# Patient Record
Sex: Female | Born: 1945 | Race: White | Hispanic: No | Smoking: Never smoker
Health system: Southern US, Community
[De-identification: ages and names within clinical notes are randomized; demographics above are authoritative.]

## PROBLEM LIST (undated history)

## (undated) DIAGNOSIS — K8000 Calculus of gallbladder with acute cholecystitis without obstruction: Principal | ICD-10-CM

## (undated) DIAGNOSIS — C73 Malignant neoplasm of thyroid gland: Secondary | ICD-10-CM

## (undated) HISTORY — PX: ABDOMINAL HYSTERECTOMY: SHX81

---

## 1982-12-19 HISTORY — PX: THYROIDECTOMY: SHX17

## 2005-10-04 ENCOUNTER — Emergency Department (HOSPITAL_COMMUNITY): Admission: EM | Admit: 2005-10-04 | Discharge: 2005-10-05 | Payer: Self-pay | Admitting: Emergency Medicine

## 2007-07-05 ENCOUNTER — Ambulatory Visit (HOSPITAL_COMMUNITY): Admission: RE | Admit: 2007-07-05 | Discharge: 2007-07-05 | Payer: Self-pay | Admitting: Obstetrics

## 2011-01-08 ENCOUNTER — Encounter: Payer: Self-pay | Admitting: Internal Medicine

## 2011-01-09 ENCOUNTER — Encounter: Payer: Self-pay | Admitting: Endocrinology

## 2011-01-09 ENCOUNTER — Encounter: Payer: Self-pay | Admitting: Obstetrics

## 2013-03-20 ENCOUNTER — Emergency Department (HOSPITAL_COMMUNITY): Payer: Medicare Other

## 2013-03-20 ENCOUNTER — Encounter (HOSPITAL_COMMUNITY): Payer: Self-pay | Admitting: Emergency Medicine

## 2013-03-20 ENCOUNTER — Encounter (HOSPITAL_COMMUNITY): Admission: EM | Disposition: A | Discharge status: Home / Self Care | Payer: Self-pay | Source: Home / Self Care

## 2013-03-20 ENCOUNTER — Encounter (HOSPITAL_COMMUNITY): Payer: Self-pay | Admitting: Anesthesiology

## 2013-03-20 ENCOUNTER — Emergency Department (HOSPITAL_COMMUNITY): Payer: Medicare Other | Admitting: Anesthesiology

## 2013-03-20 ENCOUNTER — Inpatient Hospital Stay (HOSPITAL_COMMUNITY)
Admission: EM | Admit: 2013-03-20 | Discharge: 2013-03-22 | DRG: 419 | Disposition: A | Discharge status: Home / Self Care | Payer: Medicare Other | Attending: Emergency Medicine | Admitting: Emergency Medicine

## 2013-03-20 DIAGNOSIS — Z79899 Other long term (current) drug therapy: Secondary | ICD-10-CM

## 2013-03-20 DIAGNOSIS — K8000 Calculus of gallbladder with acute cholecystitis without obstruction: Principal | ICD-10-CM | POA: Diagnosis present

## 2013-03-20 DIAGNOSIS — R1011 Right upper quadrant pain: Secondary | ICD-10-CM

## 2013-03-20 DIAGNOSIS — K824 Cholesterolosis of gallbladder: Secondary | ICD-10-CM

## 2013-03-20 DIAGNOSIS — K8050 Calculus of bile duct without cholangitis or cholecystitis without obstruction: Secondary | ICD-10-CM

## 2013-03-20 DIAGNOSIS — K8020 Calculus of gallbladder without cholecystitis without obstruction: Secondary | ICD-10-CM

## 2013-03-20 DIAGNOSIS — E0789 Other specified disorders of thyroid: Secondary | ICD-10-CM | POA: Diagnosis present

## 2013-03-20 DIAGNOSIS — K8066 Calculus of gallbladder and bile duct with acute and chronic cholecystitis without obstruction: Secondary | ICD-10-CM

## 2013-03-20 DIAGNOSIS — Z85850 Personal history of malignant neoplasm of thyroid: Secondary | ICD-10-CM

## 2013-03-20 HISTORY — DX: Calculus of gallbladder with acute cholecystitis without obstruction: K80.00

## 2013-03-20 HISTORY — DX: Malignant neoplasm of thyroid gland: C73

## 2013-03-20 HISTORY — PX: CHOLECYSTECTOMY: SHX55

## 2013-03-20 LAB — CBC WITH DIFFERENTIAL/PLATELET
Basophils Absolute: 0 10*3/uL (ref 0.0–0.1)
Basophils Relative: 0 % (ref 0–1)
Eosinophils Absolute: 0 10*3/uL (ref 0.0–0.7)
Eosinophils Relative: 0 % (ref 0–5)
HCT: 41 % (ref 36.0–46.0)
Hemoglobin: 14 g/dL (ref 12.0–15.0)
Lymphocytes Relative: 11 % — ABNORMAL LOW (ref 12–46)
Lymphs Abs: 1.1 10*3/uL (ref 0.7–4.0)
MCH: 29.6 pg (ref 26.0–34.0)
MCHC: 34.1 g/dL (ref 30.0–36.0)
MCV: 86.7 fL (ref 78.0–100.0)
Monocytes Absolute: 0.7 10*3/uL (ref 0.1–1.0)
Monocytes Relative: 6 % (ref 3–12)
Neutro Abs: 8.9 10*3/uL — ABNORMAL HIGH (ref 1.7–7.7)
Neutrophils Relative %: 83 % — ABNORMAL HIGH (ref 43–77)
Platelets: 238 10*3/uL (ref 150–400)
RBC: 4.73 MIL/uL (ref 3.87–5.11)
RDW: 12.8 % (ref 11.5–15.5)
WBC: 10.6 10*3/uL — ABNORMAL HIGH (ref 4.0–10.5)

## 2013-03-20 LAB — COMPREHENSIVE METABOLIC PANEL
ALT: 22 U/L (ref 0–35)
AST: 28 U/L (ref 0–37)
Albumin: 4.1 g/dL (ref 3.5–5.2)
Alkaline Phosphatase: 126 U/L — ABNORMAL HIGH (ref 39–117)
BUN: 8 mg/dL (ref 6–23)
CO2: 26 mEq/L (ref 19–32)
Calcium: 9.7 mg/dL (ref 8.4–10.5)
Chloride: 96 mEq/L (ref 96–112)
Creatinine, Ser: 0.4 mg/dL — ABNORMAL LOW (ref 0.50–1.10)
GFR calc Af Amer: >90 mL/min (ref >90)
GFR calc non Af Amer: >90 mL/min (ref >90)
Glucose, Bld: 129 mg/dL — ABNORMAL HIGH (ref 70–99)
Potassium: 3.7 mEq/L (ref 3.5–5.1)
Sodium: 135 mEq/L (ref 135–145)
Total Bilirubin: 0.7 mg/dL (ref 0.3–1.2)
Total Protein: 7.9 g/dL (ref 6.0–8.3)

## 2013-03-20 LAB — LIPASE, BLOOD: Lipase: 12 U/L (ref 11–59)

## 2013-03-20 SURGERY — LAPAROSCOPIC CHOLECYSTECTOMY WITH INTRAOPERATIVE CHOLANGIOGRAM
Anesthesia: General | Site: Abdomen | Wound class: Contaminated

## 2013-03-20 MED ORDER — ONDANSETRON HCL 4 MG/2ML IJ SOLN
INTRAMUSCULAR | Status: DC | PRN
  Administered 2013-03-20: 4 mg via INTRAVENOUS

## 2013-03-20 MED ORDER — LACTATED RINGERS IV SOLN: INTRAVENOUS | Status: DC

## 2013-03-20 MED ORDER — MIDAZOLAM HCL 5 MG/5ML IJ SOLN
INTRAMUSCULAR | Status: DC | PRN
  Administered 2013-03-20: 2 mg via INTRAVENOUS

## 2013-03-20 MED ORDER — HEPARIN SODIUM (PORCINE) 5000 UNIT/ML IJ SOLN
5000.0000 [IU] | Freq: Three times a day (TID) | INTRAMUSCULAR | Status: DC
  Administered 2013-03-21 – 2013-03-22 (×4): 5000 [IU] via SUBCUTANEOUS
  Filled 2013-03-20 (×7): qty 1

## 2013-03-20 MED ORDER — BUPIVACAINE-EPINEPHRINE PF 0.25-1:200000 % IJ SOLN
INTRAMUSCULAR | Status: AC
  Filled 2013-03-20: qty 30

## 2013-03-20 MED ORDER — DEXTROSE 5 % IV SOLN
1.0000 g | INTRAVENOUS | Status: DC
  Administered 2013-03-21: 1 g via INTRAVENOUS
  Filled 2013-03-20 (×2): qty 10

## 2013-03-20 MED ORDER — ONDANSETRON HCL 4 MG/2ML IJ SOLN
4.0000 mg | Freq: Once | INTRAMUSCULAR | Status: AC
  Administered 2013-03-20: 4 mg via INTRAVENOUS
  Filled 2013-03-20: qty 2

## 2013-03-20 MED ORDER — FENTANYL CITRATE 0.05 MG/ML IJ SOLN
50.0000 ug | Freq: Once | INTRAMUSCULAR | Status: AC
  Administered 2013-03-20: 100 ug via INTRAVENOUS
  Filled 2013-03-20: qty 2

## 2013-03-20 MED ORDER — HYDROMORPHONE HCL PF 1 MG/ML IJ SOLN
INTRAMUSCULAR | Status: AC
  Filled 2013-03-20: qty 1

## 2013-03-20 MED ORDER — NEOSTIGMINE METHYLSULFATE 1 MG/ML IJ SOLN
INTRAMUSCULAR | Status: DC | PRN
  Administered 2013-03-20: 4 mg via INTRAVENOUS

## 2013-03-20 MED ORDER — ONDANSETRON HCL 4 MG PO TABS: 4.0000 mg | ORAL_TABLET | Freq: Four times a day (QID) | ORAL | Status: DC | PRN

## 2013-03-20 MED ORDER — DEXTROSE 5 % IV SOLN
1.0000 g | INTRAVENOUS | Status: AC
  Administered 2013-03-20: 1 g via INTRAVENOUS
  Filled 2013-03-20: qty 10

## 2013-03-20 MED ORDER — ROCURONIUM BROMIDE 100 MG/10ML IV SOLN
INTRAVENOUS | Status: DC | PRN
  Administered 2013-03-20: 30 mg via INTRAVENOUS

## 2013-03-20 MED ORDER — OXYCODONE-ACETAMINOPHEN 5-325 MG PO TABS: 1.0000 | ORAL_TABLET | ORAL | Status: DC | PRN

## 2013-03-20 MED ORDER — HYDROMORPHONE HCL PF 1 MG/ML IJ SOLN: 0.2500 mg | INTRAMUSCULAR | Status: DC | PRN

## 2013-03-20 MED ORDER — MORPHINE SULFATE 10 MG/ML IJ SOLN: 2.0000 mg | INTRAMUSCULAR | Status: DC | PRN

## 2013-03-20 MED ORDER — PROPOFOL 10 MG/ML IV BOLUS
INTRAVENOUS | Status: DC | PRN
  Administered 2013-03-20: 160 mg via INTRAVENOUS

## 2013-03-20 MED ORDER — SODIUM CHLORIDE 0.9 % IV BOLUS (SEPSIS)
500.0000 mL | Freq: Once | INTRAVENOUS | Status: AC
  Administered 2013-03-20: 500 mL via INTRAVENOUS

## 2013-03-20 MED ORDER — DEXAMETHASONE SODIUM PHOSPHATE 10 MG/ML IJ SOLN
INTRAMUSCULAR | Status: DC | PRN
Start: 2013-03-20 — End: 2013-03-20
  Administered 2013-03-20: 10 mg via INTRAVENOUS

## 2013-03-20 MED ORDER — THYROID 60 MG PO TABS
60.0000 mg | ORAL_TABLET | Freq: Every day | ORAL | Status: DC
  Administered 2013-03-21: 60 mg via ORAL
  Filled 2013-03-20 (×2): qty 1

## 2013-03-20 MED ORDER — ONDANSETRON HCL 4 MG/2ML IJ SOLN: 4.0000 mg | Freq: Four times a day (QID) | INTRAMUSCULAR | Status: DC | PRN

## 2013-03-20 MED ORDER — SUCCINYLCHOLINE CHLORIDE 20 MG/ML IJ SOLN
INTRAMUSCULAR | Status: DC | PRN
  Administered 2013-03-20: 100 mg via INTRAVENOUS

## 2013-03-20 MED ORDER — LACTATED RINGERS IV SOLN
INTRAVENOUS | Status: DC | PRN
  Administered 2013-03-20: 500 mL via INTRAVENOUS

## 2013-03-20 MED ORDER — ACETAMINOPHEN 10 MG/ML IV SOLN
INTRAVENOUS | Status: DC | PRN
  Administered 2013-03-20: 1000 mg via INTRAVENOUS

## 2013-03-20 MED ORDER — IOHEXOL 300 MG/ML  SOLN
INTRAMUSCULAR | Status: AC
  Filled 2013-03-20: qty 1

## 2013-03-20 MED ORDER — KCL IN DEXTROSE-NACL 20-5-0.9 MEQ/L-%-% IV SOLN
INTRAVENOUS | Status: DC
  Administered 2013-03-20 – 2013-03-21 (×2): via INTRAVENOUS
  Filled 2013-03-20 (×3): qty 1000

## 2013-03-20 MED ORDER — BUPIVACAINE-EPINEPHRINE 0.25% -1:200000 IJ SOLN
INTRAMUSCULAR | Status: DC | PRN
  Administered 2013-03-20: 20 mL

## 2013-03-20 MED ORDER — IOHEXOL 300 MG/ML  SOLN
INTRAMUSCULAR | Status: DC | PRN
  Administered 2013-03-20: 5 mL via INTRAVENOUS

## 2013-03-20 MED ORDER — LACTATED RINGERS IV SOLN
INTRAVENOUS | Status: DC
  Administered 2013-03-20 (×2): via INTRAVENOUS

## 2013-03-20 MED ORDER — GLYCOPYRROLATE 0.2 MG/ML IJ SOLN
INTRAMUSCULAR | Status: DC | PRN
  Administered 2013-03-20: 0.6 mg via INTRAVENOUS

## 2013-03-20 MED ORDER — FENTANYL CITRATE 0.05 MG/ML IJ SOLN
INTRAMUSCULAR | Status: DC | PRN
  Administered 2013-03-20: 100 ug via INTRAVENOUS
  Administered 2013-03-20 (×3): 50 ug via INTRAVENOUS

## 2013-03-20 MED ORDER — LIDOCAINE HCL (CARDIAC) 20 MG/ML IV SOLN
INTRAVENOUS | Status: DC | PRN
  Administered 2013-03-20: 50 mg via INTRAVENOUS

## 2013-03-20 MED ORDER — ACETAMINOPHEN 10 MG/ML IV SOLN
INTRAVENOUS | Status: AC
  Filled 2013-03-20: qty 100

## 2013-03-20 SURGICAL SUPPLY — 48 items
ADH SKN CLS APL DERMABOND .7 (GAUZE/BANDAGES/DRESSINGS) ×1
APL SKNCLS STERI-STRIP NONHPOA (GAUZE/BANDAGES/DRESSINGS)
APPLIER CLIP ROT 10 11.4 M/L (STAPLE) ×2
APR CLP MED LRG 11.4X10 (STAPLE) ×1
BAG SPEC RTRVL LRG 6X4 10 (ENDOMECHANICALS)
BENZOIN TINCTURE PRP APPL 2/3 (GAUZE/BANDAGES/DRESSINGS) IMPLANT
CANISTER SUCTION 2500CC (MISCELLANEOUS) ×2 IMPLANT
CATH REDDICK CHOLANGI 4FR 50CM (CATHETERS) IMPLANT
CHLORAPREP W/TINT 26ML (MISCELLANEOUS) ×2 IMPLANT
CLIP APPLIE ROT 10 11.4 M/L (STAPLE) ×1 IMPLANT
CLOTH BEACON ORANGE TIMEOUT ST (SAFETY) ×2 IMPLANT
COVER MAYO STAND STRL (DRAPES) ×2 IMPLANT
DECANTER SPIKE VIAL GLASS SM (MISCELLANEOUS) ×2 IMPLANT
DERMABOND ADVANCED (GAUZE/BANDAGES/DRESSINGS) ×1
DERMABOND ADVANCED .7 DNX12 (GAUZE/BANDAGES/DRESSINGS) IMPLANT
DRAIN CHANNEL 19F RND (DRAIN) ×1 IMPLANT
DRAPE C-ARM 42X72 X-RAY (DRAPES) ×2 IMPLANT
DRAPE LAPAROSCOPIC ABDOMINAL (DRAPES) ×2 IMPLANT
ELECT REM PT RETURN 9FT ADLT (ELECTROSURGICAL) ×2
ELECTRODE REM PT RTRN 9FT ADLT (ELECTROSURGICAL) ×1 IMPLANT
EVACUATOR SILICONE 100CC (DRAIN) ×1 IMPLANT
GLOVE BIOGEL PI IND STRL 7.0 (GLOVE) ×1 IMPLANT
GLOVE BIOGEL PI INDICATOR 7.0 (GLOVE) ×1
GLOVE SS BIOGEL STRL SZ 7.5 (GLOVE) ×1 IMPLANT
GLOVE SUPERSENSE BIOGEL SZ 7.5 (GLOVE) ×1
GOWN STRL NON-REIN LRG LVL3 (GOWN DISPOSABLE) ×2 IMPLANT
GOWN STRL REIN XL XLG (GOWN DISPOSABLE) ×4 IMPLANT
HEMOSTAT SURGICEL 4X8 (HEMOSTASIS) ×1 IMPLANT
IV CATH 14GX2 1/4 (CATHETERS) IMPLANT
IV SET EXT 30 76VOL 4 MALE LL (IV SETS) IMPLANT
KIT BASIN OR (CUSTOM PROCEDURE TRAY) ×2 IMPLANT
NS IRRIG 1000ML POUR BTL (IV SOLUTION) IMPLANT
POUCH SPECIMEN RETRIEVAL 10MM (ENDOMECHANICALS) IMPLANT
SET CHOLANGIOGRAPH MIX (MISCELLANEOUS) ×2 IMPLANT
SET IRRIG TUBING LAPAROSCOPIC (IRRIGATION / IRRIGATOR) ×2 IMPLANT
SOLUTION ANTI FOG 6CC (MISCELLANEOUS) ×2 IMPLANT
STOPCOCK K 69 2C6206 (IV SETS) IMPLANT
STRIP CLOSURE SKIN 1/2X4 (GAUZE/BANDAGES/DRESSINGS) IMPLANT
SUT ETHILON 2 0 PS N (SUTURE) ×1 IMPLANT
SUT MNCRL AB 4-0 PS2 18 (SUTURE) ×2 IMPLANT
TOWEL OR 17X26 10 PK STRL BLUE (TOWEL DISPOSABLE) ×2 IMPLANT
TRAY LAP CHOLE (CUSTOM PROCEDURE TRAY) ×2 IMPLANT
TROCAR SLEEVE XCEL 5X75 (ENDOMECHANICALS) ×2 IMPLANT
TROCAR XCEL BLADELESS 5X75MML (TROCAR) ×2 IMPLANT
TROCAR XCEL BLUNT TIP 100MML (ENDOMECHANICALS) ×2 IMPLANT
TROCAR XCEL NON-BLD 11X100MML (ENDOMECHANICALS) ×2 IMPLANT
TROCAR Z-THREAD FIOS 5X100MM (TROCAR) ×2 IMPLANT
TUBING INSUFFLATION 10FT LAP (TUBING) ×2 IMPLANT

## 2013-03-20 NOTE — ED Notes (Signed)
Pt transported to [facility].

## 2013-03-20 NOTE — Anesthesia Preprocedure Evaluation (Addendum)
Anesthesia Evaluation  Patient identified by MRN, date of birth, ID band Patient awake    Reviewed: Allergy & Precautions, H&P , NPO status , Patient's Chart, lab work & pertinent test results  Airway Mallampati: II TM Distance: >3 FB Neck ROM: full    Dental no notable dental hx. (+) Teeth Intact and Dental Advisory Given   Pulmonary neg pulmonary ROS,  breath sounds clear to auscultation  Pulmonary exam normal       Cardiovascular Exercise Tolerance: Good negative cardio ROS  Rhythm:regular Rate:Normal     Neuro/Psych negative neurological ROS  negative psych ROS   GI/Hepatic negative GI ROS, Neg liver ROS,   Endo/Other  negative endocrine ROSHypothyroidism   Renal/GU negative Renal ROS  negative genitourinary   Musculoskeletal   Abdominal   Peds  Hematology negative hematology ROS (+)   Anesthesia Other Findings   Reproductive/Obstetrics negative OB ROS                          Anesthesia Physical Anesthesia Plan  ASA: I  Anesthesia Plan: General   Post-op Pain Management:    Induction: Intravenous  Airway Management Planned: Oral ETT  Additional Equipment:   Intra-op Plan:   Post-operative Plan: Extubation in OR  Informed Consent: I have reviewed the patients History and Physical, chart, labs and discussed the procedure including the risks, benefits and alternatives for the proposed anesthesia with the patient or authorized representative who has indicated his/her understanding and acceptance.   Dental Advisory Given  Plan Discussed with: CRNA and Surgeon  Anesthesia Plan Comments:         Anesthesia Quick Evaluation

## 2013-03-20 NOTE — H&P (Signed)
Tina Rose is an 67 y.o. female.    Chief Complaint: abdominal pain  HPI: patient is a very pleasant 67 year old female who developed the sudden onset of severe right upper quadrant abdominal pain 2 days ago. She has not had any previous history of significant abdominal complaints were similar problems. She describes severe pressure-like pain in her right upper quadrant and radiated somewhat around to her right flank. This has been associated with nausea and vomiting. Today she was seen in the urgent clinic at [facility] and an abdominal ultrasound was obtained. This shows cholelithiasis with a single gallstone which appears lodged in the gallbladder neck. There was Rose appreciable gallbladder wall thickening or pericholecystic fluid. Common bile duct was normal at 0.6 cm. Kidney pancreas aorta IVC all looked unremarkable. Her pain has continued unimproved and she was referred to the emergency department for further evaluation and treatment. She denies fever or chills or jaundice. Bowel movements have been normal without blood.  Past Medical History  Diagnosis Date  . Cancer of thyroid     Past Surgical History  Procedure Laterality Date  . Abdominal hysterectomy    . Thyroidectomy  1984    Rose family history on file. Social History:  reports that she has never smoked. She does not have any smokeless tobacco history on file. She reports that she does not drink alcohol or use illicit drugs.  Allergies: Rose Known Allergies  Rose current facility-administered medications for this encounter.   Current Outpatient Prescriptions  Medication Sig Dispense Refill  . thyroid (ARMOUR) 60 MG tablet Take 60 mg by mouth daily.         Results for orders placed during the hospital encounter of 03/20/13 (from the past 48 hour(s))  CBC WITH DIFFERENTIAL     Status: Abnormal   Collection Time    03/20/13 12:25 PM      Result Value Range   WBC 10.6 (*) 4.0 - 10.5 K/uL   RBC 4.73  3.87 - 5.11  MIL/uL   Hemoglobin 14.0  12.0 - 15.0 g/dL   HCT 52.8  41.3 - 24.4 %   MCV 86.7  78.0 - 100.0 fL   MCH 29.6  26.0 - 34.0 pg   MCHC 34.1  30.0 - 36.0 g/dL   RDW 01.0  27.2 - 53.6 %   Platelets 238  150 - 400 K/uL   Neutrophils Relative 83 (*) 43 - 77 %   Neutro Abs 8.9 (*) 1.7 - 7.7 K/uL   Lymphocytes Relative 11 (*) 12 - 46 %   Lymphs Abs 1.1  0.7 - 4.0 K/uL   Monocytes Relative 6  3 - 12 %   Monocytes Absolute 0.7  0.1 - 1.0 K/uL   Eosinophils Relative 0  0 - 5 %   Eosinophils Absolute 0.0  0.0 - 0.7 K/uL   Basophils Relative 0  0 - 1 %   Basophils Absolute 0.0  0.0 - 0.1 K/uL  COMPREHENSIVE METABOLIC PANEL     Status: Abnormal   Collection Time    03/20/13 12:25 PM      Result Value Range   Sodium 135  135 - 145 mEq/L   Potassium 3.7  3.5 - 5.1 mEq/L   Chloride 96  96 - 112 mEq/L   CO2 26  19 - 32 mEq/L   Glucose, Bld 129 (*) 70 - 99 mg/dL   BUN 8  6 - 23 mg/dL   Creatinine, Ser 6.44 (*) 0.50 -  1.10 mg/dL   Calcium 9.7  8.4 - 16.1 mg/dL   Total Protein 7.9  6.0 - 8.3 g/dL   Albumin 4.1  3.5 - 5.2 g/dL   AST 28  0 - 37 U/L   ALT 22  0 - 35 U/L   Alkaline Phosphatase 126 (*) 39 - 117 U/L   Total Bilirubin 0.7  0.3 - 1.2 mg/dL   GFR calc non Af Amer >90  >90 mL/min   GFR calc Af Amer >90  >90 mL/min   Comment:            The eGFR has been calculated     using the CKD EPI equation.     This calculation has not been     validated in all clinical     situations.     eGFR's persistently     <90 mL/min signify     possible Chronic Kidney Disease.  LIPASE, BLOOD     Status: None   Collection Time    03/20/13 12:25 PM      Result Value Range   Lipase 12  11 - 59 U/L   Rose results found.  Review of Systems  Constitutional: Negative for fever and chills.  Respiratory: Negative.   Cardiovascular: Negative.   Genitourinary: Negative.     Pulse 93, temperature 98.2 F (36.8 C), temperature source Oral, resp. rate 16, SpO2 94.00%. Physical Exam  General:  Healthy-appearing female who looks younger than her stated age in Rose severe distress Skin: Warm and dry without rash or infection HEENT: Well-healed thyroidectomy scar. Rose neck masses. Sclera nonicteric. Oropharynx clear. Lymph nodes: Rose cervical, supraclavicular or inguinal nodes palpable Lungs: Clear equal breath sounds without wheezing or increased work of breathing Cardiovascular: Regular rate and rhythm without murmur. Rose peripheral edema. Peripheral pulses intact. Abdomen: There is marked right upper quadrant tenderness with guarding. Rose discernible masses or organomegaly. Remainder of her abdomen is soft and nontender. Rose hernias. Extremities: Rose joint swelling or edema or cyanosis Neurologic: Alert and fully oriented. Affect normal. Rose gross motor deficits.  Assessment/Plan Acute severe persistent right upper quadrant abdominal pain with ultrasound showing a gallstone impacted in the neck of the gallbladder. This all appears consistent with acute cholecystitis. I have recommended proceeding with urgent laparoscopic cholecystectomy.I discussed the procedure in detail.    We discussed the risks and benefits of a laparoscopic cholecystectomy and possible cholangiogram including, but not limited to bleeding, infection, injury to surrounding structures such as the intestine or liver, bile leak, retained gallstones, need to convert to an open procedure, prolonged diarrhea, blood clots such as  DVT, common bile duct injury, anesthesia risks, and possible need for additional procedures.  The likelihood of improvement in symptoms and return to the patient's normal status is good. We discussed the typical post-operative recovery course. She is receiving preoperative IV antibiotics.  Dailon Sheeran T 03/20/2013, 1:59 PM

## 2013-03-20 NOTE — Transfer of Care (Signed)
Immediate Anesthesia Transfer of Care Note  Patient: Tina Rose  Procedure(s) Performed: Procedure(s): LAPAROSCOPIC CHOLECYSTECTOMY WITH INTRAOPERATIVE CHOLANGIOGRAM (N/A)  Patient Location: PACU  Anesthesia Type:General  Level of Consciousness: awake, alert  and oriented  Airway & Oxygen Therapy: Patient Spontanous Breathing and Patient connected to face mask oxygen  Post-op Assessment: Report given to PACU RN and Post -op Vital signs reviewed and stable  Post vital signs: Reviewed and stable  Complications: No apparent anesthesia complications

## 2013-03-20 NOTE — Anesthesia Postprocedure Evaluation (Signed)
  Anesthesia Post-op Note  Patient: Tina Rose  Procedure(s) Performed: Procedure(s) (LRB): LAPAROSCOPIC CHOLECYSTECTOMY WITH INTRAOPERATIVE CHOLANGIOGRAM (N/A)  Patient Location: PACU  Anesthesia Type: General  Level of Consciousness: awake and alert   Airway and Oxygen Therapy: Patient Spontanous Breathing  Post-op Pain: mild  Post-op Assessment: Post-op Vital signs reviewed, Patient's Cardiovascular Status Stable, Respiratory Function Stable, Patent Airway and No signs of Nausea or vomiting  Last Vitals:  Filed Vitals:   03/20/13 1830  BP: 142/58  Pulse: 75  Temp:   Resp: 18    Post-op Vital Signs: stable   Complications: No apparent anesthesia complications

## 2013-03-20 NOTE — Op Note (Signed)
Preoperative diagnosis: Cholelithiasis and acute cholecystitis  Postoperative diagnosis: Cholelithiasis and acute gangrenous cholecystitis  Surgical procedure: Laparoscopic cholecystectomy with intraoperative cholangiogram  Surgeon: Sharlet Salina T. Zyan Coby M.D.  Assistant: None  Anesthesia: General Endotracheal  Complications: None  Estimated blood loss: Minimal  Description of procedure: The patient brought to the operating room, placed in the supine position on the operating table, and general endotracheal anesthesia induced. The abdomen was widely sterilely prepped and draped. The patient had received preoperative IV antibiotics and PAS were in place. Patient timeout was performed the correct procedure verified. Standard 4 port technique was used with an open Hassan cannula at the umbilicus and the remainder of the ports placed under direct vision. The gallbladder was visualized. It appeared acutely inflamed and tense and partially necrotic The gallbladder was decompressed with an aspiration needle.. The fundus was grasped and elevated up over the liver and the infundibulum retracted inferiolaterally. Peritoneum anterior and posterior to close triangle was incised and fibrofatty tissue stripped off the neck of the gallbladder toward the porta hepatis. The distal gallbladder was thoroughly dissected. The cystic artery was identified in close triangle and the cystic duct gallbladder junction dissected 360.  A good critical view was obtained. When the anatomy was clear the cystic duct was clipped at the gallbladder junction and an operative cholangiogram obtained through the cystic duct. This showed good filling of a normal common bile duct and intrahepatic ducts with free flow into the duodenum and no filling defects. Following this the Cholangiocath was removed and the cystic duct was doubly clipped proximally and divided. The cystic artery was doubly clipped proximally and distally and divided. The  gallbladder was dissected free from its bed using hook cautery and removed through the umbilical port site. Complete hemostasis was obtained in the gallbladder bed. The right upper quadrant was thoroughly irrigated and hemostasis assured. A closed suction drain was left in Morrison's pouch. Trochars were removed and all CO2 evacuated and the [facility] trocar site fascial defect closed. Skin incisions were closed with subcuticular Monocryl and Dermabond. Sponge needle and instrument counts were correct. The patient was taken to PACU in good condition.  Vrishank Moster T  03/20/2013

## 2013-03-20 NOTE — ED Notes (Signed)
Report to [facility] in [place] [facility].

## 2013-03-20 NOTE — ED Provider Notes (Signed)
History     CSN: [id number]  Arrival date & time 03/20/13  1201   First MD Initiated Contact with Patient 03/20/13 1225      Chief Complaint  Patient presents with  . Abdominal Pain    (Consider location/radiation/quality/duration/timing/severity/associated sxs/prior treatment) Patient is a 67 y.o. female presenting with abdominal pain. The history is provided by the patient.  Abdominal Pain Associated symptoms: nausea and vomiting   Associated symptoms: no chest pain, no constipation, no diarrhea, no fatigue, no fever and no shortness of breath    patient has had upper abdominal pain for the last 3 days. She's also had nausea and vomiting. No diarrhea. No constipation. She is seen in urgent care and was sent for an ultrasound. The ultrasound showed biliary neck stone no wall thickening or pericholecystic fluid. Common bile duct was normal. She was sent in here for further evaluation and likely to be seen by general surgery. Patient states she's otherwise healthy. No fevers. She states she has had no appetite and has not eaten today. Pain is tall and in the right upper abdomen. It is constant. It is worse after eating.  Past Medical History  Diagnosis Date  . Cancer of thyroid     Past Surgical History  Procedure Laterality Date  . Abdominal hysterectomy    . Thyroidectomy  1984    No family history on file.  History  Substance Use Topics  . Smoking status: Never Smoker   . Smokeless tobacco: Not on file  . Alcohol Use: No    OB History   Grav Para Term Preterm Abortions TAB SAB Ect Mult Living                  Review of Systems  Constitutional: Positive for appetite change. Negative for fever, activity change and fatigue.  HENT: Negative for neck stiffness.   Eyes: Negative for pain.  Respiratory: Negative for chest tightness and shortness of breath.   Cardiovascular: Negative for chest pain and leg swelling.  Gastrointestinal: Positive for nausea, vomiting and  abdominal pain. Negative for diarrhea and constipation.  Genitourinary: Negative for flank pain.  Musculoskeletal: Negative for back pain.  Skin: Negative for rash.  Neurological: Negative for weakness, numbness and headaches.  Psychiatric/Behavioral: Negative for behavioral problems.    Allergies  Review of patient's allergies indicates no known allergies.  Home Medications   Current Outpatient Rx  Name  Route  Sig  Dispense  Refill  . thyroid (ARMOUR) 60 MG tablet   Oral   Take 60 mg by mouth daily.           BP 174/69  Pulse 89  Temp(Src) 98.2 F (36.8 C) (Oral)  Resp 16  SpO2 99%  Physical Exam  Nursing note and vitals reviewed. Constitutional: She is oriented to person, place, and time. She appears well-developed and well-nourished.  HENT:  Head: Normocephalic and atraumatic.  Eyes: EOM are normal. Pupils are equal, round, and reactive to light.  Neck: Normal range of motion. Neck supple.  Cardiovascular: Normal rate, regular rhythm and normal heart sounds.   No murmur heard. Pulmonary/Chest: Effort normal and breath sounds normal. No respiratory distress. She has no wheezes. She has no rales.  Abdominal: Soft. Bowel sounds are normal. She exhibits no distension. There is tenderness. There is no rebound and no guarding.  Moderate tenderness in right upper abdomen. No rebound or guarding.  Musculoskeletal: Normal range of motion.  Neurological: She is alert and oriented to person,  place, and time. No cranial nerve deficit.  Skin: Skin is warm and dry.  Psychiatric: She has a normal mood and affect. Her speech is normal.    ED Course  Procedures (including critical care time)  Labs Reviewed  CBC WITH DIFFERENTIAL - Abnormal; Notable for the following:    WBC 10.6 (*)    Neutrophils Relative 83 (*)    Neutro Abs 8.9 (*)    Lymphocytes Relative 11 (*)    All other components within normal limits  COMPREHENSIVE METABOLIC PANEL - Abnormal; Notable for the  following:    Glucose, Bld 129 (*)    Creatinine, Ser 0.40 (*)    Alkaline Phosphatase 126 (*)    All other components within normal limits  LIPASE, BLOOD   Dg Chest 2 View  03/20/2013  *RADIOLOGY REPORT*  Clinical Data: Thyroid cancer  CHEST - 2 VIEW  Comparison: October 04, 2005.  Findings: Cardiomediastinal silhouette appears normal.  No acute pulmonary disease is noted.  Bony thorax is intact.  IMPRESSION: No acute cardiopulmonary abnormality seen.   Original Report Authenticated By: Lupita Raider.,  M.D.      1. Biliary colic     Date: 03/20/2013  Rate:87  Rhythm: normal sinus rhythm  QRS Axis: normal  Intervals: normal  ST/T Wave abnormalities: normal  Conduction Disutrbances: none  Narrative Interpretation: unremarkable      MDM  Patient was sent in with the ultrasound showing gallbladder neck stone. She's had continued pain. She's been unable to eat. Patient was seen by general surgery and will be taken to the operating room        [facility] R. Rubin Payor, MD 03/20/13 1515

## 2013-03-20 NOTE — ED Notes (Signed)
Pt states " I have a gall stone lodged and I was told to come here" Pt complains of pain to right abdominal area and nausea and vomiting at this time.

## 2013-03-20 NOTE — Progress Notes (Signed)
Pt confirms pcp is Elie Confer  EPIC updated

## 2013-03-21 ENCOUNTER — Encounter (HOSPITAL_COMMUNITY): Payer: Self-pay | Admitting: General Surgery

## 2013-03-21 DIAGNOSIS — K8000 Calculus of gallbladder with acute cholecystitis without obstruction: Principal | ICD-10-CM | POA: Diagnosis present

## 2013-03-21 HISTORY — DX: Calculus of gallbladder with acute cholecystitis without obstruction: K80.00

## 2013-03-21 LAB — CBC
Hemoglobin: 12.6 g/dL (ref 12.0–15.0)
MCH: 29.6 pg (ref 26.0–34.0)
MCV: 87.6 fL (ref 78.0–100.0)
RBC: 4.26 MIL/uL (ref 3.87–5.11)

## 2013-03-21 LAB — COMPREHENSIVE METABOLIC PANEL
AST: 35 U/L (ref 0–37)
Albumin: 3.1 g/dL — ABNORMAL LOW (ref 3.5–5.2)
Calcium: 9.1 mg/dL (ref 8.4–10.5)
Creatinine, Ser: 0.4 mg/dL — ABNORMAL LOW (ref 0.50–1.10)
Total Protein: 6.5 g/dL (ref 6.0–8.3)

## 2013-03-21 NOTE — Care Management Note (Signed)
    Page 1 of 1   03/21/2013     11:48:18 AM   CARE MANAGEMENT NOTE 03/21/2013  Patient:  Tina Rose, Tina Rose   Account Number:  [id number]  Date Initiated:  03/21/2013  Documentation initiated by:  Lorenda Ishihara  Subjective/Objective Assessment:   67 yo female admitted s/p lap cholectomy. PTA lived at home alone.     Action/Plan:   Home when stable   Anticipated DC Date:  03/22/2013   Anticipated DC Plan:  HOME/SELF CARE      DC Planning Services  CM consult      Choice offered to / List presented to:             Status of service:  Completed, signed off Medicare Important Message given?   (If response is "NO", the following Medicare IM given date fields will be blank) Date Medicare IM given:   Date Additional Medicare IM given:    Discharge Disposition:  HOME/SELF CARE  Per UR Regulation:  Reviewed for med. necessity/level of care/duration of stay  If discussed at Long Length of Stay Meetings, dates discussed:    Comments:

## 2013-03-21 NOTE — Discharge Summary (Signed)
Physician Discharge Summary  Patient ID: Tina Rose MRN: [id number] DOB/AGE: 1946-03-14 67 y.o.  Admit date: 03/20/2013 Discharge date: 03/22/2013  Admission Diagnoses: Cholelithiasis and acute gangrenous cholecystitis   Discharge Diagnoses: Cholelithiasis and acute gangrenous cholecystitis  Active Problems:   Calculus of gallbladder with acute cholecystitis, without mention of obstruction   PROCEDURES: Laparoscopic cholecystectomy with intraoperative cholangiogram   Hospital Course:  patient is a very pleasant 67 year old female who developed the sudden onset of severe right upper quadrant abdominal pain 2 days ago. She has not had any previous history of significant abdominal complaints were similar problems. She describes severe pressure-like pain in her right upper quadrant and radiated somewhat around to her right flank. This has been associated with nausea and vomiting. Today she was seen in the urgent clinic at [facility] and an abdominal ultrasound was obtained. This shows cholelithiasis with a single gallstone which appears lodged in the gallbladder neck. There was no appreciable gallbladder wall thickening or pericholecystic fluid. Common bile duct was normal at 0.6 cm. Kidney pancreas aorta IVC all looked unremarkable. Her pain has continued unimproved and she was referred to the emergency department for further evaluation and treatment. She denies fever or chills or jaundice. Bowel movements have been normal without blood. She was seen and taken to the OR by Dr. Johna Sheriff. She was found to have an acute gangrenous gallbladder. She tolerated surgery well.  A drain was left in place.  She was maintained on antibiotics and diet was advanced. By the 2nd post op day she was ready for discharge.  Follow up in 2 weeks. Condition on D/c:  Improved   Disposition: Final discharge disposition not confirmed     Medication List    TAKE these medications       acetaminophen  325 MG tablet  Commonly known as:  TYLENOL  Do not take more than 4000 mg of tylenol, (acetaminophen) in any 24 hour period.  There is tylenol in your prescription pain medicine.     oxyCODONE-acetaminophen 5-325 MG per tablet  Commonly known as:  PERCOCET/ROXICET  Take 1-2 tablets by mouth every 4 (four) hours as needed.     thyroid 60 MG tablet  Commonly known as:  ARMOUR  Take 60 mg by mouth daily.       Follow-up Information   Schedule an appointment as soon as possible for a visit with Mariella Saa, MD.   Contact information:   [address] [place] [address] [phone]       Signed: Sherrie George 03/22/2013, 9:36 AM

## 2013-03-21 NOTE — Progress Notes (Signed)
Patient ID: Tina Rose, female   DOB: 06-13-1946, 67 y.o.   MRN: [id number] 1 Day Post-Op  Subjective: Feels much better than before surgery, pain is relieved, just sore. No nausea.  Objective: Vital signs in last 24 hours: Temp:  [98 F (36.7 C)-98.8 F (37.1 C)] 98.6 F (37 C) (04/03 0545) Pulse Rate:  [71-93] 76 (04/03 0545) Resp:  [14-20] 16 (04/03 0545) BP: (139-176)/(58-84) 152/74 mmHg (04/03 0545) SpO2:  [94 %-100 %] 96 % (04/03 0545) Weight:  [143 lb (64.864 kg)] 143 lb (64.864 kg) (04/02 1940) Last BM Date: 03/18/13  Intake/Output from previous day: 04/02 0701 - 04/03 0700 In: 1823.8 [I.V.:1823.8] Out: 2860 [Urine:2800; Drains:60] Intake/Output this shift: Total I/O In: 300 [I.V.:300] Out: -   General appearance: alert, cooperative and no distress GI: mild tenderness in the right upper quadrant. JP drainage is serosanguineous. Incision/Wound:incisions clean and dry.  Lab Results:   Recent Labs  03/20/13 1225 03/21/13 0453  WBC 10.6* 10.7*  HGB 14.0 12.6  HCT 41.0 37.3  PLT 238 196   BMET  Recent Labs  03/20/13 1225 03/21/13 0453  NA 135 137  K 3.7 3.9  CL 96 102  CO2 26 28  GLUCOSE 129* 177*  BUN 8 6  CREATININE 0.40* 0.40*  CALCIUM 9.7 9.1   Lab Results  Component Value Date   ALT 32 03/21/2013   AST 35 03/21/2013   ALKPHOS 99 03/21/2013   BILITOT 0.5 03/21/2013    Studies/Results: Dg Chest 2 View  03/20/2013  *RADIOLOGY REPORT*  Clinical Data: Thyroid cancer  CHEST - 2 VIEW  Comparison: October 04, 2005.  Findings: Cardiomediastinal silhouette appears normal.  No acute pulmonary disease is noted.  Bony thorax is intact.  IMPRESSION: No acute cardiopulmonary abnormality seen.   Original Report Authenticated By: Lupita Raider.,  M.D.    Dg Cholangiogram Operative  03/20/2013  *RADIOLOGY REPORT*  Clinical Data:   Cholecystectomy.  INTRAOPERATIVE CHOLANGIOGRAM  Technique:  Cholangiographic images from the C-arm fluoroscopic device were submitted  for interpretation post-operatively.  Please see the procedural report for the amount of contrast and the fluoroscopy time utilized.  Comparison:  None.  Findings:  Cine fluoroscopic spot images from an intraoperative cholangiogram demonstrate cannulation of the cystic duct.  The common bile duct is normal in caliber.  No filling defects are seen.  There is normal drainage of contrast into the duodenum.  The visualized intrahepatic ducts appear normal.  No contrast extravasation is seen.  IMPRESSION: Normal intraoperative cholangiogram.   Original Report Authenticated By: Rudie Meyer, M.D.     Anti-infectives: Anti-infectives   Start     Dose/Rate Route Frequency Ordered Stop   03/21/13 1400  cefTRIAXone (ROCEPHIN) 1 g in dextrose 5 % 50 mL IVPB     1 g 100 mL/hr over 30 Minutes Intravenous Every 24 hours 03/20/13 1942     03/20/13 1430  cefTRIAXone (ROCEPHIN) 1 g in dextrose 5 % 50 mL IVPB     1 g 100 mL/hr over 30 Minutes Intravenous NOW 03/20/13 1417 03/20/13 1501      Assessment/Plan: s/p Procedure(s): LAPAROSCOPIC CHOLECYSTECTOMY WITH INTRAOPERATIVE CHOLANGIOGRAM Severe acute cholecystitis with early gangrenous changes. Doing well postoperatively. Will continue IV antibiotics today and repeat lab in the morning. Probable discharge tomorrow.   LOS: 1 day    Nolberto Cheuvront T 03/21/2013

## 2013-03-22 ENCOUNTER — Encounter (HOSPITAL_COMMUNITY): Payer: Self-pay | Admitting: General Surgery

## 2013-03-22 LAB — CBC
HCT: 34.8 % — ABNORMAL LOW (ref 36.0–46.0)
MCHC: 33 g/dL (ref 30.0–36.0)
MCV: 89.5 fL (ref 78.0–100.0)
RDW: 13.5 % (ref 11.5–15.5)
WBC: 8.3 10*3/uL (ref 4.0–10.5)

## 2013-03-22 MED ORDER — OXYCODONE-ACETAMINOPHEN 5-325 MG PO TABS: 1.0000 | ORAL_TABLET | ORAL | Status: DC | PRN

## 2013-03-22 MED ORDER — ACETAMINOPHEN 325 MG PO TABS: ORAL_TABLET | ORAL | Status: DC

## 2013-03-22 NOTE — Progress Notes (Signed)
Discharge instructions reviewed with patient, vital signs are stable, incisions are within normal limits, prescription given and JP drain removed by PA Sherrie George, patient is to follow up with MD within 1-2 weeks Stanford Breed RN 03-22-2013

## 2013-03-22 NOTE — Progress Notes (Signed)
Patient interviewed and examined, agree with PA note above. She is doing well today and ready for discharge  Mariella Saa MD, FACS  03/22/2013 5:22 PM

## 2013-03-22 NOTE — Progress Notes (Signed)
2 Days Post-Op  Subjective: Doing well this AM  Objective: Vital signs in last 24 hours: Temp:  [98.5 F (36.9 C)-99 F (37.2 C)] 98.6 F (37 C) (04/04 0458) Pulse Rate:  [69-82] 78 (04/04 0458) Resp:  [16-18] 18 (04/04 0458) BP: (114-148)/(62-74) 118/63 mmHg (04/04 0458) SpO2:  [92 %-97 %] 93 % (04/04 0458) Last BM Date: 03/18/13 Regular diet, VSS, WBC 8.3 Intake/Output from previous day: 04/03 0701 - 04/04 0700 In: 1455.8 [P.O.:120; I.V.:1335.8] Out: 625 [Urine:500; Drains:125] Intake/Output this shift:    General appearance: alert, cooperative and no distress GI: soft, still tender, drainage is clear and she is doing well.  no distension.  Lab Results:   Recent Labs  03/21/13 0453 03/22/13 0420  WBC 10.7* 8.3  HGB 12.6 11.5*  HCT 37.3 34.8*  PLT 196 183    BMET  Recent Labs  03/20/13 1225 03/21/13 0453  NA 135 137  K 3.7 3.9  CL 96 102  CO2 26 28  GLUCOSE 129* 177*  BUN 8 6  CREATININE 0.40* 0.40*  CALCIUM 9.7 9.1   PT/INR No results found for this basename: LABPROT, INR,  in the last 72 hours   Recent Labs Lab 03/20/13 1225 03/21/13 0453  AST 28 35  ALT 22 32  ALKPHOS 126* 99  BILITOT 0.7 0.5  PROT 7.9 6.5  ALBUMIN 4.1 3.1*     Lipase     Component Value Date/Time   LIPASE 12 03/20/2013 1225     Studies/Results: Dg Chest 2 View  03/20/2013  *RADIOLOGY REPORT*  Clinical Data: Thyroid cancer  CHEST - 2 VIEW  Comparison: October 04, 2005.  Findings: Cardiomediastinal silhouette appears normal.  No acute pulmonary disease is noted.  Bony thorax is intact.  IMPRESSION: No acute cardiopulmonary abnormality seen.   Original Report Authenticated By: Lupita Raider.,  M.D.    Dg Cholangiogram Operative  03/20/2013  *RADIOLOGY REPORT*  Clinical Data:   Cholecystectomy.  INTRAOPERATIVE CHOLANGIOGRAM  Technique:  Cholangiographic images from the C-arm fluoroscopic device were submitted for interpretation post-operatively.  Please see the procedural  report for the amount of contrast and the fluoroscopy time utilized.  Comparison:  None.  Findings:  Cine fluoroscopic spot images from an intraoperative cholangiogram demonstrate cannulation of the cystic duct.  The common bile duct is normal in caliber.  No filling defects are seen.  There is normal drainage of contrast into the duodenum.  The visualized intrahepatic ducts appear normal.  No contrast extravasation is seen.  IMPRESSION: Normal intraoperative cholangiogram.   Original Report Authenticated By: Rudie Meyer, M.D.     Medications: . cefTRIAXone (ROCEPHIN)  IV  1 g Intravenous Q24H  . heparin subcutaneous  5,000 Units Subcutaneous Q8H  . thyroid  60 mg Oral Daily    Assessment/Plan Severe acute cholecystitis with early gangrenous changes. Hx of Cancer of thyroid    Plan: Home today.   LOS: 2 days    Tina Rose 03/22/2013

## 2013-04-11 ENCOUNTER — Ambulatory Visit (INDEPENDENT_AMBULATORY_CARE_PROVIDER_SITE_OTHER): Payer: Medicare Other | Admitting: General Surgery

## 2013-04-11 ENCOUNTER — Encounter (INDEPENDENT_AMBULATORY_CARE_PROVIDER_SITE_OTHER): Payer: Self-pay | Admitting: General Surgery

## 2013-04-11 VITALS — BP 120/70 | HR 66 | Temp 98.0°F | Resp 18 | Ht 59.0 in | Wt 144.8 lb

## 2013-04-11 DIAGNOSIS — Z09 Encounter for follow-up examination after completed treatment for conditions other than malignant neoplasm: Secondary | ICD-10-CM

## 2013-04-11 NOTE — Progress Notes (Signed)
History: Patient returns 3 weeks following urgent laparoscopic cholecystectomy for severe acute cholecystitis. She reports she is getting along very well. She denies any abdominal or GI complaints, fever or other concerns. She reports interestingly that bothersome back pain she experienced since last year is also resolved.  Exam: BP 120/70  Pulse 66  Temp(Src) 98 F (36.7 C) (Temporal)  Resp 18  Ht 4\' 11"  (1.499 m)  Wt 144 lb 12.8 oz (65.681 kg)  BMI 29.23 kg/m2 Appears well. Abdomen is soft and nontender her wounds well healed without hernias.  Pathology confirmed cholelithiasis and acute cholecystitis  Assessment and plan: Doing well following laparoscopic cholecystectomy without complication. She was discharged return as needed.

## 2013-07-24 ENCOUNTER — Other Ambulatory Visit: Payer: Self-pay

## 2013-08-20 ENCOUNTER — Observation Stay (HOSPITAL_COMMUNITY)
Admission: EM | Admit: 2013-08-20 | Discharge: 2013-08-21 | Disposition: A | Discharge status: Home / Self Care | Payer: Medicare Other | Attending: Internal Medicine | Admitting: Internal Medicine

## 2013-08-20 ENCOUNTER — Encounter (HOSPITAL_COMMUNITY): Payer: Self-pay | Admitting: Emergency Medicine

## 2013-08-20 ENCOUNTER — Emergency Department (HOSPITAL_COMMUNITY): Payer: Medicare Other

## 2013-08-20 DIAGNOSIS — Z79899 Other long term (current) drug therapy: Secondary | ICD-10-CM | POA: Insufficient documentation

## 2013-08-20 DIAGNOSIS — R110 Nausea: Secondary | ICD-10-CM | POA: Diagnosis present

## 2013-08-20 DIAGNOSIS — K8000 Calculus of gallbladder with acute cholecystitis without obstruction: Secondary | ICD-10-CM

## 2013-08-20 DIAGNOSIS — R0789 Other chest pain: Principal | ICD-10-CM | POA: Diagnosis present

## 2013-08-20 DIAGNOSIS — I6529 Occlusion and stenosis of unspecified carotid artery: Secondary | ICD-10-CM | POA: Insufficient documentation

## 2013-08-20 DIAGNOSIS — R531 Weakness: Secondary | ICD-10-CM | POA: Diagnosis present

## 2013-08-20 DIAGNOSIS — R079 Chest pain, unspecified: Secondary | ICD-10-CM

## 2013-08-20 DIAGNOSIS — E0789 Other specified disorders of thyroid: Secondary | ICD-10-CM | POA: Insufficient documentation

## 2013-08-20 DIAGNOSIS — Z90710 Acquired absence of both cervix and uterus: Secondary | ICD-10-CM | POA: Insufficient documentation

## 2013-08-20 DIAGNOSIS — R5381 Other malaise: Secondary | ICD-10-CM | POA: Insufficient documentation

## 2013-08-20 DIAGNOSIS — E039 Hypothyroidism, unspecified: Secondary | ICD-10-CM | POA: Diagnosis present

## 2013-08-20 DIAGNOSIS — C73 Malignant neoplasm of thyroid gland: Secondary | ICD-10-CM | POA: Insufficient documentation

## 2013-08-20 DIAGNOSIS — Z9089 Acquired absence of other organs: Secondary | ICD-10-CM | POA: Insufficient documentation

## 2013-08-20 DIAGNOSIS — R0989 Other specified symptoms and signs involving the circulatory and respiratory systems: Secondary | ICD-10-CM | POA: Diagnosis present

## 2013-08-20 LAB — CBC
HCT: 39.8 % (ref 36.0–46.0)
Hemoglobin: 13.4 g/dL (ref 12.0–15.0)
MCH: 30.9 pg (ref 26.0–34.0)
MCHC: 33.7 g/dL (ref 30.0–36.0)

## 2013-08-20 LAB — BASIC METABOLIC PANEL
BUN: 15 mg/dL (ref 6–23)
CO2: 29 mEq/L (ref 19–32)
Calcium: 9.1 mg/dL (ref 8.4–10.5)
GFR calc non Af Amer: >90 mL/min (ref >90)
Glucose, Bld: 103 mg/dL — ABNORMAL HIGH (ref 70–99)
Potassium: 3.8 mEq/L (ref 3.5–5.1)

## 2013-08-20 MED ORDER — DEXTROSE-NACL 5-0.9 % IV SOLN
INTRAVENOUS | Status: DC
  Administered 2013-08-20: 23:00:00 via INTRAVENOUS
  Administered 2013-08-21: 100 mL/h via INTRAVENOUS

## 2013-08-20 MED ORDER — ONDANSETRON HCL 4 MG PO TABS: 4.0000 mg | ORAL_TABLET | Freq: Four times a day (QID) | ORAL | Status: DC | PRN

## 2013-08-20 MED ORDER — THYROID 60 MG PO TABS
60.0000 mg | ORAL_TABLET | Freq: Every day | ORAL | Status: DC
  Administered 2013-08-21: 60 mg via ORAL
  Filled 2013-08-20: qty 1

## 2013-08-20 MED ORDER — ASPIRIN 325 MG PO TABS
325.0000 mg | ORAL_TABLET | ORAL | Status: AC
  Administered 2013-08-20: 325 mg via ORAL
  Filled 2013-08-20: qty 1

## 2013-08-20 MED ORDER — SODIUM CHLORIDE 0.9 % IJ SOLN
3.0000 mL | Freq: Two times a day (BID) | INTRAMUSCULAR | Status: DC
  Administered 2013-08-20: 3 mL via INTRAVENOUS

## 2013-08-20 MED ORDER — ONDANSETRON HCL 4 MG/2ML IJ SOLN: 4.0000 mg | Freq: Four times a day (QID) | INTRAMUSCULAR | Status: DC | PRN

## 2013-08-20 MED ORDER — MORPHINE SULFATE 2 MG/ML IJ SOLN: 2.0000 mg | INTRAMUSCULAR | Status: DC | PRN

## 2013-08-20 MED ORDER — FAMOTIDINE IN NACL 20-0.9 MG/50ML-% IV SOLN
20.0000 mg | Freq: Two times a day (BID) | INTRAVENOUS | Status: DC
  Administered 2013-08-20 – 2013-08-21 (×2): 20 mg via INTRAVENOUS
  Filled 2013-08-20 (×3): qty 50

## 2013-08-20 MED ORDER — ASPIRIN EC 325 MG PO TBEC
325.0000 mg | DELAYED_RELEASE_TABLET | Freq: Every day | ORAL | Status: DC
  Administered 2013-08-21: 325 mg via ORAL
  Filled 2013-08-20: qty 1

## 2013-08-20 MED ORDER — NITROGLYCERIN 0.4 MG SL SUBL
0.4000 mg | SUBLINGUAL_TABLET | SUBLINGUAL | Status: DC | PRN
  Administered 2013-08-20: 0.4 mg via SUBLINGUAL
  Filled 2013-08-20: qty 25

## 2013-08-20 NOTE — ED Notes (Signed)
Pt arrived to the ED from urgent care with a complaint of chest pain.  Pt states she has had chest pain located in the central chest since Sunday.  Pt states that she became diaphoretic today and still had the centralized pain that is described by pt as pressure.  Pt is not short of breath, alert and orientated, with no radiation of pain.

## 2013-08-20 NOTE — ED Provider Notes (Signed)
CSN: [id number]     Arrival date & time 08/20/13  1928 History   First MD Initiated Contact with Patient 08/20/13 1956     Chief Complaint  Patient presents with  . Chest Pain   (Consider location/radiation/quality/duration/timing/severity/associated sxs/prior Treatment) HPI Patient is a 67 year old female who presents to emergency department with complaint of a chest pain. Patient states chest pains began 2 days ago. Initially were intermittent lasting approximately 30 minutes at a time. Patient states felt like a pressure in the center of the chest. Pain was nonexertional but random in occurrence is. Patient states that now pain has been constant for approximately 2 hours. States chest pains are associated with diaphoresis. Patient denies any shortness of breath or dizziness. Patient states that she has been feeling fatigued for the last 2 days and states that she is unable to perform any physical activities or tasks without getting tired. Patient states the pain does radiate into the back. Patient denies any fever, chills, cough, abdominal pain, urinary symptoms. Patient did not try taking any medications for this. Patient does not have any known cardiac problems. Patient's only medical history thyroid cancer which is in remission and she takes thyroid medications. Patient denies any recent travel or surgeries.  Past Medical History  Diagnosis Date  . Cancer of thyroid   . Calculus of gallbladder with acute cholecystitis, without mention of obstruction 03/21/2013   Past Surgical History  Procedure Laterality Date  . Abdominal hysterectomy    . Thyroidectomy  1984  . Cholecystectomy N/A 03/20/2013    Procedure: LAPAROSCOPIC CHOLECYSTECTOMY WITH INTRAOPERATIVE CHOLANGIOGRAM;  Surgeon: Mariella Saa, MD;  Location: [facility];  Service: General;  Laterality: N/A;   Family History  Problem Relation Age of Onset  . Alzheimer's disease Mother   . Kidney Stones Father   . Cancer Sister     Tongue    History  Substance Use Topics  . Smoking status: Never Smoker   . Smokeless tobacco: Never Used  . Alcohol Use: No   OB History   Grav Para Term Preterm Abortions TAB SAB Ect Mult Living                 Review of Systems  Constitutional: Positive for diaphoresis and fatigue. Negative for fever and chills.  HENT: Negative for neck pain and neck stiffness.   Respiratory: Positive for chest tightness. Negative for cough and shortness of breath.   Cardiovascular: Positive for chest pain. Negative for palpitations and leg swelling.  Gastrointestinal: Negative for nausea, vomiting, abdominal pain and diarrhea.  Genitourinary: Negative for dysuria and flank pain.  Musculoskeletal: Negative for myalgias and arthralgias.  Skin: Negative for rash.  Neurological: Positive for weakness. Negative for dizziness and headaches.  All other systems reviewed and are negative.    Allergies  Review of patient's allergies indicates no known allergies.  Home Medications   Current Outpatient Rx  Name  Route  Sig  Dispense  Refill  . thyroid (ARMOUR) 60 MG tablet   Oral   Take 60 mg by mouth daily.          BP 143/67  Pulse 67  Temp(Src) 98.2 F (36.8 C) (Oral)  Resp 15  SpO2 96% Physical Exam  Nursing note and vitals reviewed. Constitutional: She appears well-developed and well-nourished. No distress.  HENT:  Head: Normocephalic.  Eyes: Conjunctivae are normal.  Neck: Neck supple.  Cardiovascular: Normal rate, regular rhythm and normal heart sounds.   Pulmonary/Chest: Effort normal and breath  sounds normal. No respiratory distress. She has no wheezes. She has no rales.  Abdominal: Soft. Bowel sounds are normal. She exhibits no distension. There is no tenderness. There is no rebound.  Musculoskeletal: She exhibits no edema.  Neurological: She is alert.  Skin: Skin is warm and dry.  Psychiatric: She has a normal mood and affect. Her behavior is normal.    ED Course  Procedures  (including critical care time)    Date: 08/20/2013  Rate: 77  Rhythm: normal sinus rhythm  QRS Axis: normal  Intervals: normal  ST/T Wave abnormalities: normal  Conduction Disutrbances: none  Narrative Interpretation:   Old EKG Reviewed: No significant changes noted     Labs Review Labs Reviewed  BASIC METABOLIC PANEL - Abnormal; Notable for the following:    Glucose, Bld 103 (*)    All other components within normal limits  HEPATIC FUNCTION PANEL - Abnormal; Notable for the following:    Albumin 3.3 (*)    Alkaline Phosphatase 130 (*)    All other components within normal limits  CBC  TROPONIN I  LIPASE, BLOOD  TSH  TROPONIN I  TROPONIN I  POCT I-STAT TROPONIN I   Imaging Review Dg Chest 2 View  08/20/2013   *RADIOLOGY REPORT*  Clinical Data: Chest pain  CHEST - 2 VIEW  Comparison: 03/20/2013  Findings: Heart size and vascularity are normal.  Negative for heart failure.  Lungs are free of infiltrate effusion or mass.  IMPRESSION: No active cardiopulmonary abnormality.   Original Report Authenticated By: Janeece Riggers, M.D.    MDM   1. Chest pain   2. Atypical chest pain   3. Hypothyroidism   4. Left carotid bruit    Patient with intermittent chest pain for 2 days, constant pain today and associated with diaphoresis. Patient has no cardiac problems diagnosed in the past. She has minimal risk factors, she is not a smoker, and she is nondiabetic, no history of hypertension or elevated cholesterol. She is otherwise relatively healthy. Patient was getting aspirin. 25 mg chewable by mouth in the emergency department as well as one sublingual nitroglycerin which relieved her pain completely. Patient at present is chest pain-free. Workup so far is unremarkable including normal EKG, negative troponin, negative labs. Chest x-ray is normal as well. Although she is low risk her story is concerning for possible coronary artery disease. She will be admitted for chest pain rule  out.  Spoke with [facility] admit.  Filed Vitals:   08/20/13 1951 08/20/13 2335  BP: 143/67 137/47  Pulse: 67 73  Temp: 98.2 F (36.8 C) 98 F (36.7 C)  TempSrc: Oral Oral  Resp: 15 16  Height:  4\' 11"  (1.499 m)  Weight:  144 lb 10 oz (65.6 kg)  SpO2: 96% 100%       Myriam Jacobson Ren Aspinall, PA-C 08/21/13 0103

## 2013-08-20 NOTE — H&P (Signed)
[facility] History and Physical  Dafina Suk ZOX:[id number] DOB: 03/12/1946    PCP:   Elie Confer, MD   Chief Complaint: Intermittent substernal chest pain for the past 3 days.  HPI: Tina Rose is an 67 y.o. female with benign PMH including s/p CCY in 4/14 for acute cholecystitis and cholelithiasis, s/p thyroidectomy for thyroid cancer on Amour thyroid 120mg  per day, with no hx of CAD, HTN, hyperlipidemia nor DM, presents to the ER with 3 days hx of intermittent substernal chest pain, accompanied by nausea, no vomiting and radation of the pain between her shoulder blades.  She denied fever, chills, exertional symptoms, black or bloody stool. She has been feeling very weak, which is unusual for her.  She did drink some white wine last week, and its rare that she uses any alcohol.  There has been no change in her diet.  Evaluation in the ER included an EKG which was normal, negative troponin, and negative CXR.  She was given a NTG and her chest tightness resolved.  Hospitalist was asked to admit her for CP r/out.  Rewiew of Systems:  Constitutional: Negative for malaise, fever and chills. No significant weight loss or weight gain Eyes: Negative for eye pain, redness and discharge, diplopia, visual changes, or flashes of light. ENMT: Negative for ear pain, hoarseness, nasal congestion, sinus pressure and sore throat. No headaches; tinnitus, drooling, or problem swallowing. Cardiovascular: Negative for  palpitations, diaphoresis, dyspnea and peripheral edema. ; No orthopnea, PND Respiratory: Negative for cough, hemoptysis, wheezing and stridor. No pleuritic chestpain. Gastrointestinal: Negative for vomiting, diarrhea, constipation, abdominal pain, melena, blood in stool, hematemesis, jaundice and rectal bleeding.    Genitourinary: Negative for frequency, dysuria, incontinence,flank pain and hematuria; Musculoskeletal: Negative for back pain and neck pain. Negative for  swelling and trauma.;  Skin: . Negative for pruritus, rash, abrasions, bruising and skin lesion.; ulcerations Neuro: Negative for headache, lightheadedness and neck stiffness. Negative for weakness, altered level of consciousness , altered mental status, extremity weakness, burning feet, involuntary movement, seizure and syncope.  Psych: negative for anxiety, depression, insomnia, tearfulness, panic attacks, hallucinations, paranoia, suicidal or homicidal ideation    Past Medical History  Diagnosis Date  . Cancer of thyroid   . Calculus of gallbladder with acute cholecystitis, without mention of obstruction 03/21/2013    Past Surgical History  Procedure Laterality Date  . Abdominal hysterectomy    . Thyroidectomy  1984  . Cholecystectomy N/A 03/20/2013    Procedure: LAPAROSCOPIC CHOLECYSTECTOMY WITH INTRAOPERATIVE CHOLANGIOGRAM;  Surgeon: Mariella Saa, MD;  Location: [facility];  Service: General;  Laterality: N/A;    Medications:  HOME MEDS: Prior to Admission medications   Medication Sig Start Date End Date Taking? Authorizing Provider  Multiple Vitamin (MULTIVITAMIN WITH MINERALS) TABS tablet Take 1 tablet by mouth daily.   Yes Historical Provider, MD  thyroid (ARMOUR) 60 MG tablet Take 60 mg by mouth daily.   Yes Historical Provider, MD     Allergies:  No Known Allergies  Social History:   reports that she has never smoked. She has never used smokeless tobacco. She reports that she does not drink alcohol or use illicit drugs.  Family History: Family History  Problem Relation Age of Onset  . Alzheimer's disease Mother   . Kidney Stones Father   . Cancer Sister     Tongue     Physical Exam: Filed Vitals:   08/20/13 1951  BP: 143/67  Pulse: 67  Temp: 98.2 F (36.8 C)  TempSrc: Oral  Resp: 15  SpO2: 96%   Blood pressure 143/67, pulse 67, temperature 98.2 F (36.8 C), temperature source Oral, resp. rate 15, SpO2 96.00%.  GEN:  Pleasant patient lying in the  stretcher in no acute distress; cooperative with exam. PSYCH:  alert and oriented x4; does not appear anxious or depressed; affect is appropriate. HEENT: Mucous membranes pink and anicteric; PERRLA; EOM intact; no cervical lymphadenopathy nor thyromegaly  no JVD; There were no stridor. Neck is very supple.  She has a carotid bruit on the left. Breasts:: Not examined CHEST WALL: No tenderness CHEST: Normal respiration, clear to auscultation bilaterally.  HEART: Regular rate and rhythm.  There are no murmur, rub, or gallops.   BACK: No kyphosis or scoliosis; no CVA tenderness ABDOMEN: soft and non-tender; no masses, no organomegaly, normal abdominal bowel sounds; no pannus; no intertriginous candida. There is no rebound and no distention. Rectal Exam: Not done EXTREMITIES: No bone or joint deformity; age-appropriate arthropathy of the hands and knees; no edema; no ulcerations.  There is no calf tenderness. Genitalia: not examined PULSES: 2+ and symmetric SKIN: Normal hydration no rash or ulceration CNS: Cranial nerves 2-12 grossly intact no focal lateralizing neurologic deficit.  Speech is fluent; uvula elevated with phonation, facial symmetry and tongue midline. DTR are normal bilaterally, cerebella exam is intact, barbinski is negative and strengths are equaled bilaterally.  No sensory loss.   Labs on Admission:  Basic Metabolic Panel:  Recent Labs Lab 08/20/13 2000  NA 136  K 3.8  CL 101  CO2 29  GLUCOSE 103*  BUN 15  CREATININE 0.61  CALCIUM 9.1   Liver Function Tests: No results found for this basename: AST, ALT, ALKPHOS, BILITOT, PROT, ALBUMIN,  in the last 168 hours No results found for this basename: LIPASE, AMYLASE,  in the last 168 hours No results found for this basename: AMMONIA,  in the last 168 hours CBC:  Recent Labs Lab 08/20/13 2000  WBC 5.7  HGB 13.4  HCT 39.8  MCV 91.7  PLT 217   Cardiac Enzymes: No results found for this basename: CKTOTAL, CKMB,  CKMBINDEX, TROPONINI,  in the last 168 hours  CBG: No results found for this basename: GLUCAP,  in the last 168 hours   Radiological Exams on Admission: Dg Chest 2 View  08/20/2013   *RADIOLOGY REPORT*  Clinical Data: Chest pain  CHEST - 2 VIEW  Comparison: 03/20/2013  Findings: Heart size and vascularity are normal.  Negative for heart failure.  Lungs are free of infiltrate effusion or mass.  IMPRESSION: No active cardiopulmonary abnormality.   Original Report Authenticated By: Janeece Riggers, M.D.    EKG: Independently reviewed. NSR with no acute ST-T changes.   Assessment/Plan Present on Admission:  . Atypical chest pain . Nausea alone . Weakness generalized . Left carotid bruit . Hypothyroidism  PLAN: Will admit her for atypical chest pain, r/out.  Will obtain ECHO and cycle her troponins.  She would benefit getting a stress test if possible inpatient, but i think it can be done as outpatient as well.  Her symptoms could very well be GI as well.  I will start her on PPI along with ASA.  Will obtain LFTs and Lipase, and if positive, will get abdominal US.  She does have a carotid bruit on the left, and will obtain a carotid doppler.  For her hypothyroidism (Hx of thyroidectomy for CA), will continue amour thyroid and check TSH.  She is stable, full code,  and will be admitted to [facility] service.  Thank you for allowing me to participate in her care.  Other plans as per orders.  Code Status: FULL Unk Lightning, MD. [facility] Pager [phone] 7pm to 7am.  08/20/2013, 10:29 PM

## 2013-08-20 NOTE — ED Provider Notes (Signed)
Patient reports she's been having intermittent chest pain it started 2 days ago. She states it started in the center of her chest and then started going to her right chest. She states the pain will come and go and will be gone for 30-60 minutes and return. She does not related to anything she does. She does not have any nausea. She denies any change in activity or any unusual events. She states she's never had this before and has never had any cardiac problems in the past.  Pt is alert and cooperative in NAD. She is in NSR.   Medical screening examination/treatment/procedure(s) were conducted as a shared visit with non-physician practitioner(s) and myself.  I personally evaluated the patient during the encounter  Devoria Albe, MD, Franz Dell, MD 08/20/13 2234

## 2013-08-21 DIAGNOSIS — R0989 Other specified symptoms and signs involving the circulatory and respiratory systems: Secondary | ICD-10-CM

## 2013-08-21 DIAGNOSIS — R079 Chest pain, unspecified: Secondary | ICD-10-CM

## 2013-08-21 DIAGNOSIS — R072 Precordial pain: Secondary | ICD-10-CM

## 2013-08-21 DIAGNOSIS — K8000 Calculus of gallbladder with acute cholecystitis without obstruction: Secondary | ICD-10-CM

## 2013-08-21 LAB — TSH: TSH: 0.02 u[IU]/mL — ABNORMAL LOW (ref 0.350–4.500)

## 2013-08-21 LAB — LIPASE, BLOOD: Lipase: 24 U/L (ref 11–59)

## 2013-08-21 LAB — HEPATIC FUNCTION PANEL: Total Protein: 6.4 g/dL (ref 6.0–8.3)

## 2013-08-21 LAB — TROPONIN I: Troponin I: <0.3 ng/mL (ref <0.30)

## 2013-08-21 MED ORDER — HYDROCODONE-ACETAMINOPHEN 5-325 MG PO TABS: 1.0000 | ORAL_TABLET | Freq: Four times a day (QID) | ORAL | Status: AC | PRN

## 2013-08-21 NOTE — Care Management Note (Signed)
    Page 1 of 1   08/21/2013     1:35:52 PM   CARE MANAGEMENT NOTE 08/21/2013  Patient:  Tina Rose, Tina Rose   Account Number:  [id number]  Date Initiated:  08/21/2013  Documentation initiated by:  Lanier Clam  Subjective/Objective Assessment:   67 Y/O F ADMITTED W/CHEST PAIN.     Action/Plan:   FROM HOME.   Anticipated DC Date:  08/21/2013   Anticipated DC Plan:  HOME/SELF CARE      DC Planning Services  CM consult      Choice offered to / List presented to:             Status of service:  Completed, signed off Medicare Important Message given?   (If response is "NO", the following Medicare IM given date fields will be blank) Date Medicare IM given:   Date Additional Medicare IM given:    Discharge Disposition:  HOME/SELF CARE  Per UR Regulation:  Reviewed for med. necessity/level of care/duration of stay  If discussed at Long Length of Stay Meetings, dates discussed:    Comments:  08/21/13 Breyton Vanscyoc RN,BSN NCM [phone] D/C HOME NO NEEDS OR ORDERS.

## 2013-08-21 NOTE — Discharge Summary (Signed)
Physician Discharge Summary  Tina Rose ZOX:[id number] DOB: 05-21-1946 DOA: 08/20/2013  PCP: Elie Confer, MD  Admit date: 08/20/2013 Discharge date: 08/21/2013  Recommendations for Outpatient Follow-up:  1. Pt will need to follow up with PCP in 2-3 weeks post discharge 2. Please obtain BMP to evaluate electrolytes and kidney function 3. Please also check CBC to evaluate Hg and Hct levels 4. Please recheck TSH and readjust the dose of thyroid tablet as indicated  5. Please note that referral to cardiology made while pt inpatient, [facility] will call pt to schedule and appointment time and date   Discharge Diagnoses: Atypical chest pain  Principal Problem:   Atypical chest pain Active Problems:   Nausea alone   Weakness generalized   Left carotid bruit   Hypothyroidism  Discharge Condition: Stable  Diet recommendation: Heart healthy diet discussed in details   History of present illness:  67 y.o. female with benign PMH including s/p CCY in 4/14 for acute cholecystitis and cholelithiasis, s/p thyroidectomy for thyroid cancer on Amour thyroid 120mg  per day, with no hx of CAD, HTN, hyperlipidemia nor DM, presents to the ER with 3 days hx of intermittent substernal chest pain, accompanied by nausea, no vomiting and radation of the pain between her shoulder blades. She denied fever, chills, exertional symptoms, black or bloody stool. She has been feeling very weak, which is unusual for her. She did drink some white wine last week, and its rare that she uses any alcohol. There has been no change in her diet. Evaluation in the ER included an EKG which was normal, negative troponin, and negative CXR. She was given a NTG and her chest tightness resolved. Hospitalist was asked to admit her for CP r/out.   Hospital Course:  Principal Problem:   Atypical chest pain - likely musculoskeletal in etiology, ACS ruled out - troponins negative, no events on telemetry over 24 hours, TSH is  slightly low but pt says she was told it has to be on the lower side - will continue her thyroid medication for now and will send the note to PCP to recheck TSH and readjust the dose of medication as indicated  - pt denies chest pain this am and wants to go home Active Problems:   Nausea alone - unclear etiology and now resolved, electrolytes stable, vitals stable, pt tolerating current regular diet well    Left carotid bruit - pt aware, stable    Hypothyroidism - continue home regimen   Procedures/Studies: Dg Chest 2 View   08/20/2013  No active cardiopulmonary abnormality.   Consultations:  None  Antibiotics:  None  Discharge Exam: Filed Vitals:   08/21/13 0534  BP: 138/47  Pulse: 67  Temp: 98.2 F (36.8 C)  Resp: 18   Filed Vitals:   08/20/13 1951 08/20/13 2335 08/21/13 0534  BP: 143/67 137/47 138/47  Pulse: 67 73 67  Temp: 98.2 F (36.8 C) 98 F (36.7 C) 98.2 F (36.8 C)  TempSrc: Oral Oral Oral  Resp: 15 16 18   Height:  4\' 11"  (1.499 m)   Weight:  65.6 kg (144 lb 10 oz)   SpO2: 96% 100% 100%    General: Pt is alert, follows commands appropriately, not in acute distress Cardiovascular: Regular rate and rhythm, S1/S2 +, no murmurs, no rubs, no gallops Respiratory: Clear to auscultation bilaterally, no wheezing, no crackles, no rhonchi Abdominal: Soft, non tender, non distended, bowel sounds +, no guarding Extremities: no edema, no cyanosis, pulses palpable bilaterally DP and  PT Neuro: Grossly nonfocal  Discharge Instructions  Discharge Orders   Future Orders Complete By Expires   Diet - low sodium heart healthy  As directed    Increase activity slowly  As directed        Medication List         HYDROcodone-acetaminophen 5-325 MG per tablet  Commonly known as:  NORCO/VICODIN  Take 1 tablet by mouth every 6 (six) hours as needed for pain.     multivitamin with minerals Tabs tablet  Take 1 tablet by mouth daily.     thyroid 60 MG tablet  Commonly  known as:  ARMOUR  Take 60 mg by mouth daily.           Follow-up Information   Follow up with Elie Confer, MD In 2 weeks.   Specialty:  Family Medicine   Contact information:   [address] [place] [address] [phone]       Follow up with [facility] Main Office [facility]). (they will call you from the office to let you know about the appointment time and date)    Specialty:  Cardiology   Contact information:   [address] [place] [address] [phone]      Follow up with Debbora Presto, MD. (call my cell phone with any questions [phone])    Specialty:  Internal Medicine   Contact information:   [address] [place] [address] [phone]        The results of significant diagnostics from this hospitalization (including imaging, microbiology, ancillary and laboratory) are listed below for reference.     Microbiology: No results found for this or any previous visit (from the past 240 hour(s)).   Labs: Basic Metabolic Panel:  Recent Labs Lab 08/20/13 2000  NA 136  K 3.8  CL 101  CO2 29  GLUCOSE 103*  BUN 15  CREATININE 0.61  CALCIUM 9.1   Liver Function Tests:  Recent Labs Lab 08/21/13  AST 22  ALT 20  ALKPHOS 130*  BILITOT 0.4  PROT 6.4  ALBUMIN 3.3*    Recent Labs Lab 08/21/13  LIPASE 24   CBC:  Recent Labs Lab 08/20/13 2000  WBC 5.7  HGB 13.4  HCT 39.8  MCV 91.7  PLT 217   Cardiac Enzymes:  Recent Labs Lab 08/21/13 08/21/13 0442  TROPONINI <0.30 <0.30   SIGNED: Time coordinating discharge: Over 30 minutes  Debbora Presto, MD  [facility] 08/21/2013, 9:56 AM Pager [phone]  If 7PM-7AM, please contact night-coverage [email or web address] Password [email or web address]

## 2013-08-21 NOTE — Progress Notes (Signed)
*  PRELIMINARY RESULTS* Vascular Ultrasound Carotid Duplex (Doppler) has been completed.   Findings suggest 1-39% internal carotid artery stenosis bilaterally without evidence of significant plaque morphology. Vertebral arteries are patent with antegrade flow.  08/21/2013 10:12 AM Gertie Fey, RVT, RDCS, RDMS

## 2013-08-21 NOTE — Progress Notes (Signed)
Discharge to home ambulatory, no complaints of any pain or discomfort upon discharge.D/c instructions and follow up appointment done and was given to the patient. PIV removed no s/s of infiltration or swelling noted on insertion site.

## 2013-08-21 NOTE — ED Provider Notes (Signed)
See prior note   Ward Givens, MD 08/21/13 1002

## 2013-08-21 NOTE — Progress Notes (Signed)
Echo Lab  2D Echocardiogram completed.  Tavi Hoogendoorn L Lilo [place], RDCS 08/21/2013 11:11 AM

## 2013-10-24 ENCOUNTER — Other Ambulatory Visit: Payer: Self-pay

## 2014-03-19 ENCOUNTER — Other Ambulatory Visit: Payer: Self-pay | Admitting: Family Medicine

## 2014-03-19 ENCOUNTER — Ambulatory Visit
Admission: RE | Admit: 2014-03-19 | Discharge: 2014-03-19 | Disposition: A | Discharge status: Home / Self Care | Payer: Medicare Other | Source: Ambulatory Visit | Attending: Family Medicine | Admitting: Family Medicine

## 2014-03-19 DIAGNOSIS — M25562 Pain in left knee: Principal | ICD-10-CM

## 2014-03-19 DIAGNOSIS — M25561 Pain in right knee: Secondary | ICD-10-CM

## 2015-05-28 ENCOUNTER — Inpatient Hospital Stay (HOSPITAL_COMMUNITY)
Admission: EM | Admit: 2015-05-28 | Discharge: 2015-05-29 | DRG: 313 | Discharge status: Against Medical Advice | Payer: Medicare Other | Attending: Emergency Medicine | Admitting: Emergency Medicine

## 2015-05-28 ENCOUNTER — Encounter (HOSPITAL_COMMUNITY): Payer: Self-pay

## 2015-05-28 ENCOUNTER — Emergency Department (HOSPITAL_COMMUNITY): Payer: Medicare Other

## 2015-05-28 DIAGNOSIS — R0789 Other chest pain: Secondary | ICD-10-CM | POA: Diagnosis not present

## 2015-05-28 DIAGNOSIS — R030 Elevated blood-pressure reading, without diagnosis of hypertension: Secondary | ICD-10-CM | POA: Diagnosis present

## 2015-05-28 DIAGNOSIS — R5383 Other fatigue: Secondary | ICD-10-CM | POA: Diagnosis present

## 2015-05-28 DIAGNOSIS — R079 Chest pain, unspecified: Secondary | ICD-10-CM | POA: Diagnosis present

## 2015-05-28 DIAGNOSIS — E039 Hypothyroidism, unspecified: Secondary | ICD-10-CM

## 2015-05-28 DIAGNOSIS — Z79899 Other long term (current) drug therapy: Secondary | ICD-10-CM

## 2015-05-28 LAB — CBC
HCT: 34.9 % — ABNORMAL LOW (ref 36.0–46.0)
HEMOGLOBIN: 10.8 g/dL — AB (ref 12.0–15.0)
MCH: 24.2 pg — ABNORMAL LOW (ref 26.0–34.0)
MCHC: 30.9 g/dL (ref 30.0–36.0)
MCV: 78.1 fL (ref 78.0–100.0)
PLATELETS: 273 10*3/uL (ref 150–400)
RBC: 4.47 MIL/uL (ref 3.87–5.11)
RDW: 18.7 % — ABNORMAL HIGH (ref 11.5–15.5)
WBC: 5.5 10*3/uL (ref 4.0–10.5)

## 2015-05-28 LAB — I-STAT CHEM 8, ED
BUN: 17 mg/dL (ref 6–20)
Calcium, Ion: 1.15 mmol/L (ref 1.13–1.30)
Chloride: 102 mmol/L (ref 101–111)
Creatinine, Ser: 0.6 mg/dL (ref 0.44–1.00)
Glucose, Bld: 106 mg/dL — ABNORMAL HIGH (ref 65–99)
HCT: 36 % (ref 36.0–46.0)
HEMOGLOBIN: 12.2 g/dL (ref 12.0–15.0)
Potassium: 4.2 mmol/L (ref 3.5–5.1)
SODIUM: 140 mmol/L (ref 135–145)
TCO2: 23 mmol/L (ref 0–100)

## 2015-05-28 LAB — I-STAT TROPONIN, ED: Troponin i, poc: 0 ng/mL (ref 0.00–0.08)

## 2015-05-28 MED ORDER — NITROGLYCERIN 0.4 MG SL SUBL
0.4000 mg | SUBLINGUAL_TABLET | SUBLINGUAL | Status: DC | PRN
  Administered 2015-05-28: 0.4 mg via SUBLINGUAL
  Filled 2015-05-28: qty 1

## 2015-05-28 MED ORDER — ASPIRIN 81 MG PO CHEW
162.0000 mg | CHEWABLE_TABLET | Freq: Once | ORAL | Status: AC
Start: 2015-05-28 — End: 2015-05-28
  Administered 2015-05-28: 162 mg via ORAL
  Filled 2015-05-28: qty 2

## 2015-05-28 NOTE — ED Provider Notes (Signed)
CSN: [id number]     Arrival date & time 05/28/15  2155 History   First MD Initiated Contact with Patient 05/28/15 2208     Chief Complaint  Patient presents with  . Chest Pain     (Consider location/radiation/quality/duration/timing/severity/associated sxs/prior Treatment) HPI Comments: Patient is a 69 year old female with no family history of heart disease, no personal history of risk factors for heart disease and does not smoke cigarettes. She has been having intermittent waxing and waning symptoms of chest discomfort over the last 2 days described as a heaviness or squeezing around the left side of her chest in the mid chest, radiation to the neck or jaw and the left shoulder as well as the left upper back, nothing seems to make this better or worse, last night it was intermittent, today it has been persistent. 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 She also states that she has been severely fatigued. She has no energy to get up and move at all. She has no history of similar symptoms and has never had cardiac testing. She initially went to the [place] [place] walk-in clinic this evening where she was told to come to the emergency department because of elevated blood pressure, concerning story despite having a normal-appearing EKG.  162 mg of ASA taken at home pta  Patient is a 69 y.o. female presenting with chest pain. The history is provided by the patient.  Chest Pain   Past Medical History  Diagnosis Date  . Cancer of thyroid   . Calculus of gallbladder with acute cholecystitis, without mention of obstruction 03/21/2013   Past Surgical History  Procedure Laterality Date  . Abdominal hysterectomy    . Thyroidectomy  1984  . Cholecystectomy N/A 03/20/2013    Procedure: LAPAROSCOPIC CHOLECYSTECTOMY WITH INTRAOPERATIVE CHOLANGIOGRAM;  Surgeon: Edward Jolly, MD;  Location: [facility]  ORS;  Service: General;  Laterality: N/A;   Family History  Problem Relation Age of Onset  . Alzheimer's disease Mother   . Kidney Stones Father   . Cancer Sister     Tongue   History  Substance Use Topics  . Smoking status: Never Smoker   . Smokeless tobacco: Never Used  . Alcohol Use: No   OB History    No data available     Review of Systems  Cardiovascular: Positive for chest pain.  All other systems reviewed and are negative.     Allergies  Review of patient's allergies indicates no known allergies.  Home Medications   Prior to Admission medications   Medication Sig Start Date End Date Taking? Authorizing Provider  buPROPion (WELLBUTRIN) 100 MG tablet Take 1 tablet by mouth daily. 04/20/15  Yes Historical Provider, MD  Multiple Vitamin (MULTIVITAMIN WITH MINERALS) TABS tablet Take 1 tablet by mouth daily.   Yes Historical Provider, MD  thyroid (ARMOUR) 60 MG tablet Take 60 mg by mouth daily.   Yes Historical Provider, MD  HYDROcodone-acetaminophen (NORCO/VICODIN) 5-325 MG per tablet Take 1 tablet by mouth every 6 (six) hours as needed for pain. Patient not taking: Reported on 05/28/2015 08/21/13   Theodis Blaze, MD   BP 142/60 mmHg  Pulse 78  Temp(Src) 98.1 F (36.7 C) (Oral)  Resp 20  SpO2 98% Physical Exam  Constitutional: She appears well-developed and well-nourished. No distress.  HENT:  Head: Normocephalic and atraumatic.  Mouth/Throat: Oropharynx is clear and moist.  No oropharyngeal exudate.  Eyes: Conjunctivae and EOM are normal. Pupils are equal, round, and reactive to light. Right eye exhibits no discharge. Left eye exhibits no discharge. No scleral icterus.  Neck: Normal range of motion. Neck supple. No JVD present. No thyromegaly present.  Cardiovascular: Normal rate, regular rhythm, normal heart sounds and intact distal pulses.  Exam reveals no gallop and no friction rub.   No murmur heard. Pulmonary/Chest: Effort normal and breath sounds normal. No  respiratory distress. She has no wheezes. She has no rales.  Abdominal: Soft. Bowel sounds are normal. She exhibits no distension and no mass. There is no tenderness.  Musculoskeletal: Normal range of motion. She exhibits no edema or tenderness.  Lymphadenopathy:    She has no cervical adenopathy.  Neurological: She is alert. Coordination normal.  Skin: Skin is warm and dry. No rash noted. No erythema.  Psychiatric: She has a normal mood and affect. Her behavior is normal.  Nursing note and vitals reviewed.   ED Course  Procedures (including critical care time) Labs Review Labs Reviewed  [place], ED  I-STAT CHEM 8, ED    Imaging Review No results found.   EKG Interpretation   Date/Time:  Thursday May 28 2015 22:10:07 EDT Ventricular Rate:  71 PR Interval:  157 QRS Duration: 87 QT Interval:  397 QTC Calculation: 431 R Axis:   5 Text Interpretation:  Sinus rhythm Atrial premature complex Abnormal  R-wave progression, early transition since last tracing no significant  change Confirmed by Alberta Lenhard  MD, Joy Reiger ([id number]) on 05/28/2015 10:29:40 PM      MDM   Final diagnoses:  Chest pain    Unremarkable EKG, vital signs are totally normal except for mild blood pressure elevation at 142/60, she expresses her understanding to the workup including labs, chest x-ray. We'll also give 162 mg of aspirin and nitroglycerin sublingual.  Pt has neg labs / CXR - VS normal, story concerning, needs admission.  Meds given in ED:  Medications  nitroGLYCERIN (NITROSTAT) SL tablet 0.4 mg (0.4 mg Sublingual Given 05/28/15 2254)  aspirin chewable tablet 162 mg (162 mg Oral Given 05/28/15 2253)      Noemi Chapel, MD 05/28/15 2330

## 2015-05-28 NOTE — ED Notes (Signed)
hospitalist at bedside

## 2015-05-28 NOTE — ED Notes (Signed)
Pt was seen at the [facility] today for chest pain and they sent her here for a more extensive chest pain workup

## 2015-05-28 NOTE — ED Notes (Addendum)
Patient denies chest pain at present after nitro admin

## 2015-05-29 NOTE — ED Notes (Addendum)
Hospitalist spoke with patient at length regarding admission and risks of leaving AMA.  Patient verbalized understanding of medical risks. But insists on going home

## 2015-05-29 NOTE — ED Provider Notes (Signed)
Patient initially seen by Dr. Sabra Heck. Dr. in [place] to admit.  Patient is refusing admission. She understands that heart disease cannot be ruled out in the ER. Risk and benefits were explained to the patient and she stated understanding. She is signing out [place]. She is discussed this with both Dr. Blaine Hamper and myself.  Merryl Hacker, MD 05/29/15 [phone]

## 2015-05-29 NOTE — ED Notes (Signed)
Dr. Dina Rich at bedside.  Patient verbalized understanding.  Patient left ED AMA.  Form signed and witnessed by myself.

## 2015-06-15 ENCOUNTER — Other Ambulatory Visit: Payer: Self-pay

## 2015-07-13 IMAGING — CR DG CHEST 2V
2 series · 2 of 2 positions shown · non-contrast
Comparison: 08/20/2013, 03/20/2013

CLINICAL DATA: 68-year-old female with a history of mid chest pain.

EXAM:
CHEST - 2 VIEW

[w chest pa]
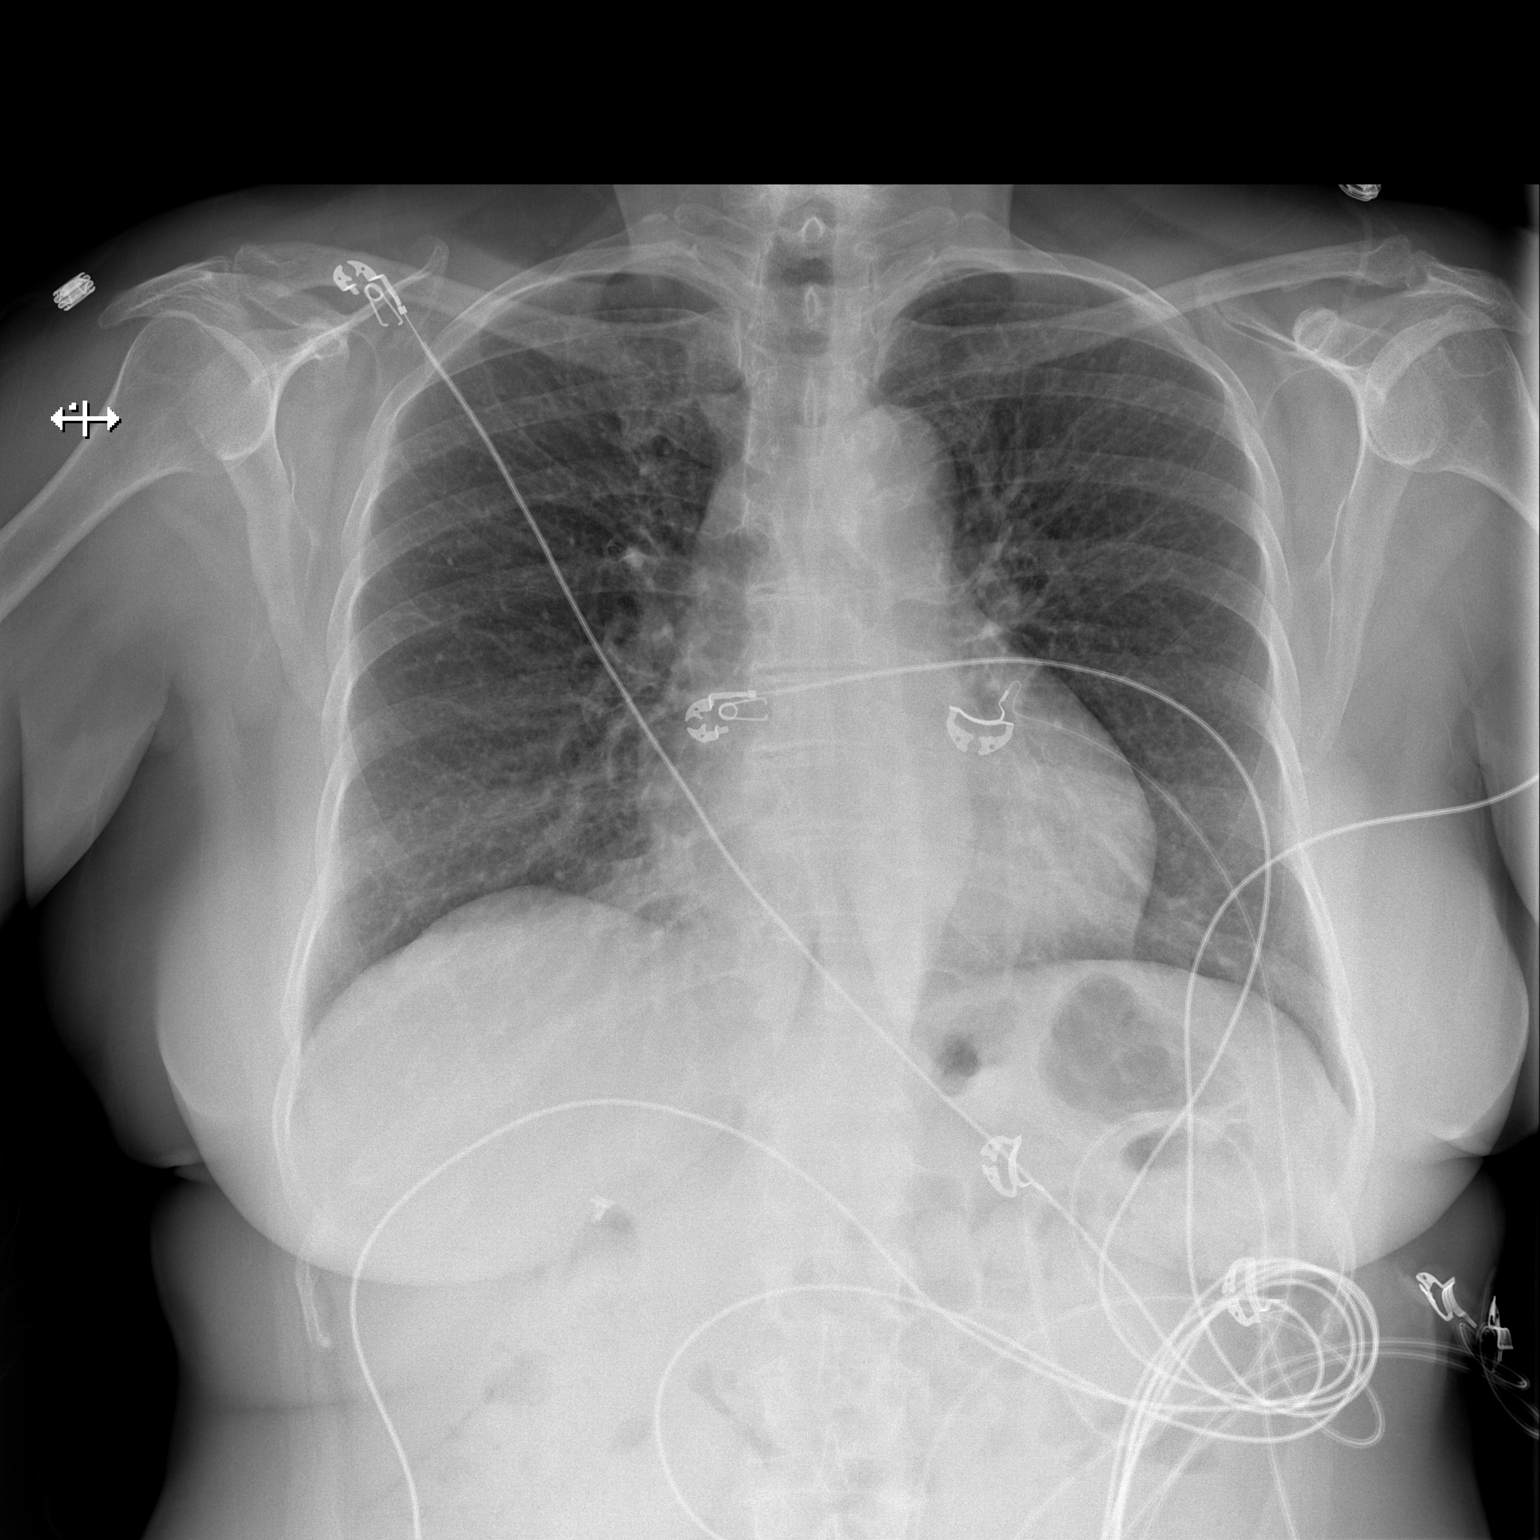

[w chest lat]
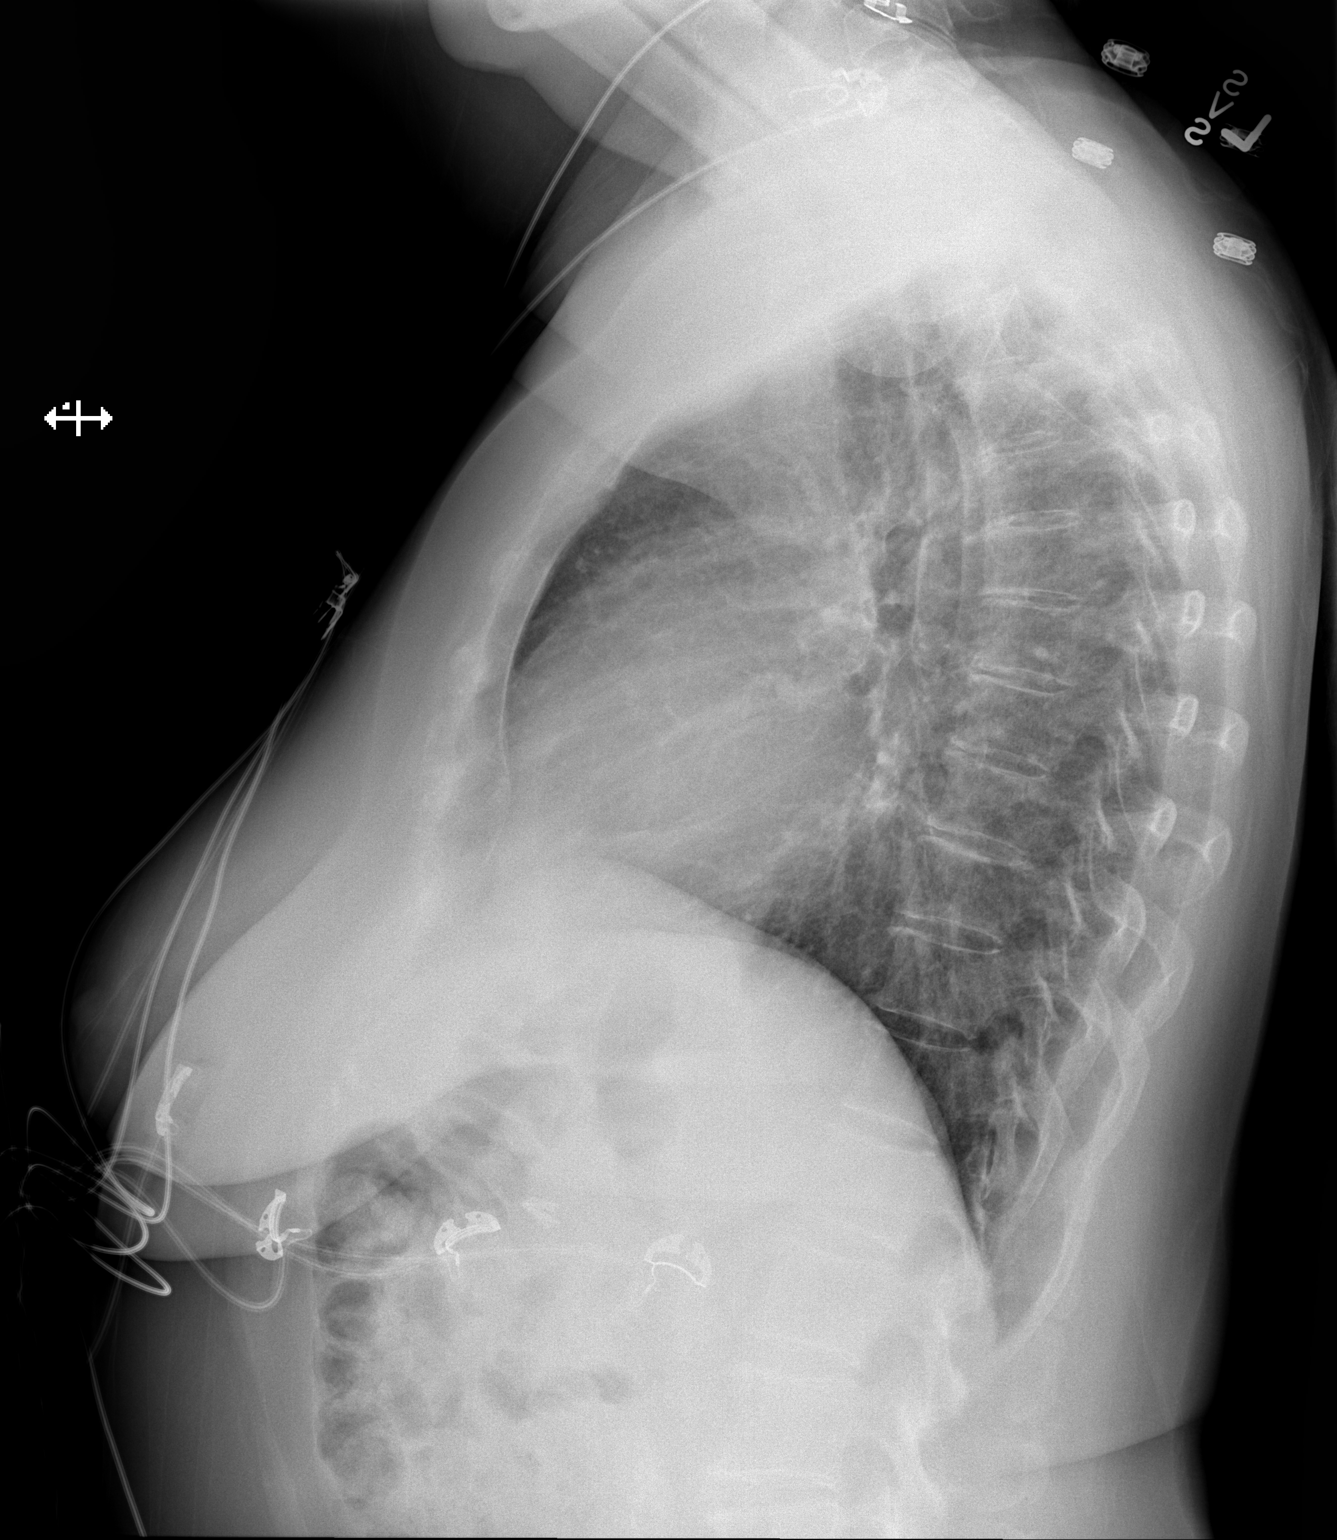

[2 of 2 positions shown; findings below may reference images not displayed]

FINDINGS: Cardiomediastinal silhouette projects within normal limits in size
and contour.

No confluent airspace disease, pneumothorax, or pleural effusion.
Persistent coarsening of the interstitial markings.

No displaced fracture.

Surgical changes of cholecystectomy
IMPRESSION: No radiographic evidence of acute cardiopulmonary disease, with
background of chronic lung changes.

## 2016-09-14 ENCOUNTER — Ambulatory Visit (INDEPENDENT_AMBULATORY_CARE_PROVIDER_SITE_OTHER): Payer: Medicare Other | Admitting: Podiatry

## 2016-09-14 ENCOUNTER — Encounter: Payer: Self-pay | Admitting: Podiatry

## 2016-09-14 VITALS — BP 139/76 | HR 70 | Resp 14 | Ht 59.0 in | Wt 148.0 lb

## 2016-09-14 DIAGNOSIS — M2041 Other hammer toe(s) (acquired), right foot: Secondary | ICD-10-CM | POA: Diagnosis not present

## 2016-09-14 DIAGNOSIS — M129 Arthropathy, unspecified: Secondary | ICD-10-CM

## 2016-09-14 DIAGNOSIS — L84 Corns and callosities: Secondary | ICD-10-CM

## 2016-09-14 NOTE — Progress Notes (Signed)
   Subjective:    Patient ID: Tina Rose, female    DOB: [birth year], 70 y.o.   MRN: [id number]  HPI this patient presents the office with chief complaint of a painful corn on the fifth toe of the right foot foot. She says she is been having a corn developing on the inside of her toenail fifth toe right foot. She states she usually is able to remove it herself but this time it has become painful. She presents the office for an evaluation and treatment. She also states that she is been experiencing pain on the top of her left foot since yesterday.  She says she frequently wears sandals. wears her sandals.  She presents for evaluation and treatment.    Review of Systems  All other systems reviewed and are negative.      Objective:   Physical Exam GENERAL APPEARANCE: Alert, conversant. Appropriately groomed. No acute distress.  VASCULAR: Pedal pulses are  palpable at  [facility] and PT bilateral.  Capillary refill time is immediate to all digits,  Normal temperature gradient.   NEUROLOGIC: sensation is normal to 5.07 monofilament at 5/5 sites bilateral.  Light touch is intact bilateral, Muscle strength normal.  MUSCULOSKELETAL: acceptable muscle strength, tone and stability bilateral.  Intrinsic muscluature intact bilateral.  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  Palpable pain on the dorsum of the left foot.  DERMATOLOGIC: skin color, texture, and turgor are within normal limits.  No preulcerative lesions or ulcers  are seen, no interdigital maceration noted.  No open lesions present.  Digital nails are asymptomatic. No drainage noted.         Assessment & Plan:  Hammer toe fifth toe right foot.  Foot arthritis left foot.   Ie.  Debridement of corn between fourth and fifth toe of the right foot. Told her not to use acid treatment in the future dispensed padding between the fourth and fifth toes, right foot. Told her to continue to wear her orthotics.Marland KitchenMarland KitchenGardiner Barefoot  DPM

## 2016-09-15 ENCOUNTER — Ambulatory Visit: Payer: Medicare Other | Admitting: Podiatry

## 2017-12-22 DIAGNOSIS — E039 Hypothyroidism, unspecified: Secondary | ICD-10-CM | POA: Diagnosis not present

## 2017-12-22 DIAGNOSIS — I4901 Ventricular fibrillation: Secondary | ICD-10-CM | POA: Diagnosis not present

## 2018-01-16 DIAGNOSIS — E890 Postprocedural hypothyroidism: Secondary | ICD-10-CM | POA: Diagnosis not present

## 2018-01-16 DIAGNOSIS — Z0000 Encounter for general adult medical examination without abnormal findings: Secondary | ICD-10-CM | POA: Diagnosis not present

## 2018-02-27 DIAGNOSIS — Z1211 Encounter for screening for malignant neoplasm of colon: Secondary | ICD-10-CM | POA: Diagnosis not present

## 2018-02-27 DIAGNOSIS — R197 Diarrhea, unspecified: Secondary | ICD-10-CM | POA: Diagnosis not present

## 2018-03-01 DIAGNOSIS — R197 Diarrhea, unspecified: Secondary | ICD-10-CM | POA: Diagnosis not present

## 2018-04-05 DIAGNOSIS — M542 Cervicalgia: Secondary | ICD-10-CM | POA: Diagnosis not present

## 2018-04-05 DIAGNOSIS — M47812 Spondylosis without myelopathy or radiculopathy, cervical region: Secondary | ICD-10-CM | POA: Diagnosis not present

## 2018-04-05 DIAGNOSIS — M62830 Muscle spasm of back: Secondary | ICD-10-CM | POA: Diagnosis not present

## 2018-04-05 DIAGNOSIS — M9901 Segmental and somatic dysfunction of cervical region: Secondary | ICD-10-CM | POA: Diagnosis not present

## 2018-04-07 DIAGNOSIS — B001 Herpesviral vesicular dermatitis: Secondary | ICD-10-CM | POA: Diagnosis not present

## 2018-04-10 DIAGNOSIS — M542 Cervicalgia: Secondary | ICD-10-CM | POA: Diagnosis not present

## 2018-04-10 DIAGNOSIS — M62830 Muscle spasm of back: Secondary | ICD-10-CM | POA: Diagnosis not present

## 2018-04-10 DIAGNOSIS — M9901 Segmental and somatic dysfunction of cervical region: Secondary | ICD-10-CM | POA: Diagnosis not present

## 2018-04-10 DIAGNOSIS — M47812 Spondylosis without myelopathy or radiculopathy, cervical region: Secondary | ICD-10-CM | POA: Diagnosis not present

## 2018-04-12 DIAGNOSIS — M47812 Spondylosis without myelopathy or radiculopathy, cervical region: Secondary | ICD-10-CM | POA: Diagnosis not present

## 2018-04-12 DIAGNOSIS — M62830 Muscle spasm of back: Secondary | ICD-10-CM | POA: Diagnosis not present

## 2018-04-12 DIAGNOSIS — M542 Cervicalgia: Secondary | ICD-10-CM | POA: Diagnosis not present

## 2018-04-12 DIAGNOSIS — M9901 Segmental and somatic dysfunction of cervical region: Secondary | ICD-10-CM | POA: Diagnosis not present

## 2018-04-16 DIAGNOSIS — B0229 Other postherpetic nervous system involvement: Secondary | ICD-10-CM | POA: Diagnosis not present

## 2018-04-16 DIAGNOSIS — B029 Zoster without complications: Secondary | ICD-10-CM | POA: Diagnosis not present

## 2018-04-17 DIAGNOSIS — M47812 Spondylosis without myelopathy or radiculopathy, cervical region: Secondary | ICD-10-CM | POA: Diagnosis not present

## 2018-04-17 DIAGNOSIS — M9901 Segmental and somatic dysfunction of cervical region: Secondary | ICD-10-CM | POA: Diagnosis not present

## 2018-04-17 DIAGNOSIS — M542 Cervicalgia: Secondary | ICD-10-CM | POA: Diagnosis not present

## 2018-04-17 DIAGNOSIS — M62830 Muscle spasm of back: Secondary | ICD-10-CM | POA: Diagnosis not present

## 2018-04-26 DIAGNOSIS — M9901 Segmental and somatic dysfunction of cervical region: Secondary | ICD-10-CM | POA: Diagnosis not present

## 2018-04-26 DIAGNOSIS — M542 Cervicalgia: Secondary | ICD-10-CM | POA: Diagnosis not present

## 2018-04-26 DIAGNOSIS — M62830 Muscle spasm of back: Secondary | ICD-10-CM | POA: Diagnosis not present

## 2018-04-26 DIAGNOSIS — M47812 Spondylosis without myelopathy or radiculopathy, cervical region: Secondary | ICD-10-CM | POA: Diagnosis not present

## 2018-05-01 DIAGNOSIS — M47812 Spondylosis without myelopathy or radiculopathy, cervical region: Secondary | ICD-10-CM | POA: Diagnosis not present

## 2018-05-01 DIAGNOSIS — M9901 Segmental and somatic dysfunction of cervical region: Secondary | ICD-10-CM | POA: Diagnosis not present

## 2018-05-01 DIAGNOSIS — M62830 Muscle spasm of back: Secondary | ICD-10-CM | POA: Diagnosis not present

## 2018-05-01 DIAGNOSIS — M542 Cervicalgia: Secondary | ICD-10-CM | POA: Diagnosis not present

## 2018-05-10 DIAGNOSIS — M9901 Segmental and somatic dysfunction of cervical region: Secondary | ICD-10-CM | POA: Diagnosis not present

## 2018-05-10 DIAGNOSIS — M62830 Muscle spasm of back: Secondary | ICD-10-CM | POA: Diagnosis not present

## 2018-05-10 DIAGNOSIS — M47812 Spondylosis without myelopathy or radiculopathy, cervical region: Secondary | ICD-10-CM | POA: Diagnosis not present

## 2018-05-10 DIAGNOSIS — M542 Cervicalgia: Secondary | ICD-10-CM | POA: Diagnosis not present

## 2018-05-17 DIAGNOSIS — M47812 Spondylosis without myelopathy or radiculopathy, cervical region: Secondary | ICD-10-CM | POA: Diagnosis not present

## 2018-05-17 DIAGNOSIS — M62830 Muscle spasm of back: Secondary | ICD-10-CM | POA: Diagnosis not present

## 2018-05-17 DIAGNOSIS — M542 Cervicalgia: Secondary | ICD-10-CM | POA: Diagnosis not present

## 2018-05-17 DIAGNOSIS — M9901 Segmental and somatic dysfunction of cervical region: Secondary | ICD-10-CM | POA: Diagnosis not present

## 2018-05-24 DIAGNOSIS — M542 Cervicalgia: Secondary | ICD-10-CM | POA: Diagnosis not present

## 2018-05-24 DIAGNOSIS — M9901 Segmental and somatic dysfunction of cervical region: Secondary | ICD-10-CM | POA: Diagnosis not present

## 2018-05-24 DIAGNOSIS — M47812 Spondylosis without myelopathy or radiculopathy, cervical region: Secondary | ICD-10-CM | POA: Diagnosis not present

## 2018-05-24 DIAGNOSIS — M62830 Muscle spasm of back: Secondary | ICD-10-CM | POA: Diagnosis not present

## 2018-06-07 DIAGNOSIS — M47812 Spondylosis without myelopathy or radiculopathy, cervical region: Secondary | ICD-10-CM | POA: Diagnosis not present

## 2018-06-07 DIAGNOSIS — M62830 Muscle spasm of back: Secondary | ICD-10-CM | POA: Diagnosis not present

## 2018-06-07 DIAGNOSIS — M542 Cervicalgia: Secondary | ICD-10-CM | POA: Diagnosis not present

## 2018-06-07 DIAGNOSIS — M9901 Segmental and somatic dysfunction of cervical region: Secondary | ICD-10-CM | POA: Diagnosis not present

## 2018-06-14 DIAGNOSIS — M542 Cervicalgia: Secondary | ICD-10-CM | POA: Diagnosis not present

## 2018-06-14 DIAGNOSIS — M9901 Segmental and somatic dysfunction of cervical region: Secondary | ICD-10-CM | POA: Diagnosis not present

## 2018-06-14 DIAGNOSIS — M62830 Muscle spasm of back: Secondary | ICD-10-CM | POA: Diagnosis not present

## 2018-06-14 DIAGNOSIS — M47812 Spondylosis without myelopathy or radiculopathy, cervical region: Secondary | ICD-10-CM | POA: Diagnosis not present

## 2018-07-02 DIAGNOSIS — M47812 Spondylosis without myelopathy or radiculopathy, cervical region: Secondary | ICD-10-CM | POA: Diagnosis not present

## 2018-07-02 DIAGNOSIS — M62830 Muscle spasm of back: Secondary | ICD-10-CM | POA: Diagnosis not present

## 2018-07-02 DIAGNOSIS — M9901 Segmental and somatic dysfunction of cervical region: Secondary | ICD-10-CM | POA: Diagnosis not present

## 2018-07-02 DIAGNOSIS — M542 Cervicalgia: Secondary | ICD-10-CM | POA: Diagnosis not present

## 2018-07-12 DIAGNOSIS — H524 Presbyopia: Secondary | ICD-10-CM | POA: Diagnosis not present

## 2018-07-19 DIAGNOSIS — M62830 Muscle spasm of back: Secondary | ICD-10-CM | POA: Diagnosis not present

## 2018-07-19 DIAGNOSIS — M47812 Spondylosis without myelopathy or radiculopathy, cervical region: Secondary | ICD-10-CM | POA: Diagnosis not present

## 2018-07-19 DIAGNOSIS — M9901 Segmental and somatic dysfunction of cervical region: Secondary | ICD-10-CM | POA: Diagnosis not present

## 2018-07-19 DIAGNOSIS — M542 Cervicalgia: Secondary | ICD-10-CM | POA: Diagnosis not present

## 2018-07-30 DIAGNOSIS — H40023 Open angle with borderline findings, high risk, bilateral: Secondary | ICD-10-CM | POA: Diagnosis not present

## 2018-08-02 DIAGNOSIS — M542 Cervicalgia: Secondary | ICD-10-CM | POA: Diagnosis not present

## 2018-08-02 DIAGNOSIS — M47812 Spondylosis without myelopathy or radiculopathy, cervical region: Secondary | ICD-10-CM | POA: Diagnosis not present

## 2018-08-02 DIAGNOSIS — M62830 Muscle spasm of back: Secondary | ICD-10-CM | POA: Diagnosis not present

## 2018-08-02 DIAGNOSIS — M9901 Segmental and somatic dysfunction of cervical region: Secondary | ICD-10-CM | POA: Diagnosis not present

## 2018-08-03 DIAGNOSIS — E785 Hyperlipidemia, unspecified: Secondary | ICD-10-CM | POA: Diagnosis not present

## 2018-08-03 DIAGNOSIS — Z5181 Encounter for therapeutic drug level monitoring: Secondary | ICD-10-CM | POA: Diagnosis not present

## 2018-08-03 DIAGNOSIS — Z0000 Encounter for general adult medical examination without abnormal findings: Secondary | ICD-10-CM | POA: Diagnosis not present

## 2018-08-03 DIAGNOSIS — R69 Illness, unspecified: Secondary | ICD-10-CM | POA: Diagnosis not present

## 2018-08-03 DIAGNOSIS — E039 Hypothyroidism, unspecified: Secondary | ICD-10-CM | POA: Diagnosis not present

## 2018-08-16 DIAGNOSIS — M542 Cervicalgia: Secondary | ICD-10-CM | POA: Diagnosis not present

## 2018-08-16 DIAGNOSIS — M47812 Spondylosis without myelopathy or radiculopathy, cervical region: Secondary | ICD-10-CM | POA: Diagnosis not present

## 2018-08-16 DIAGNOSIS — M9901 Segmental and somatic dysfunction of cervical region: Secondary | ICD-10-CM | POA: Diagnosis not present

## 2018-08-16 DIAGNOSIS — M62830 Muscle spasm of back: Secondary | ICD-10-CM | POA: Diagnosis not present

## 2018-08-22 DIAGNOSIS — Z0100 Encounter for examination of eyes and vision without abnormal findings: Secondary | ICD-10-CM | POA: Diagnosis not present

## 2018-08-22 DIAGNOSIS — H401112 Primary open-angle glaucoma, right eye, moderate stage: Secondary | ICD-10-CM | POA: Diagnosis not present

## 2018-08-22 DIAGNOSIS — H401121 Primary open-angle glaucoma, left eye, mild stage: Secondary | ICD-10-CM | POA: Diagnosis not present

## 2018-08-25 DIAGNOSIS — L237 Allergic contact dermatitis due to plants, except food: Secondary | ICD-10-CM | POA: Diagnosis not present

## 2018-08-30 DIAGNOSIS — M542 Cervicalgia: Secondary | ICD-10-CM | POA: Diagnosis not present

## 2018-08-30 DIAGNOSIS — M47812 Spondylosis without myelopathy or radiculopathy, cervical region: Secondary | ICD-10-CM | POA: Diagnosis not present

## 2018-08-30 DIAGNOSIS — M62830 Muscle spasm of back: Secondary | ICD-10-CM | POA: Diagnosis not present

## 2018-08-30 DIAGNOSIS — M9901 Segmental and somatic dysfunction of cervical region: Secondary | ICD-10-CM | POA: Diagnosis not present

## 2018-09-27 DIAGNOSIS — R509 Fever, unspecified: Secondary | ICD-10-CM | POA: Diagnosis not present

## 2018-09-27 DIAGNOSIS — J111 Influenza due to unidentified influenza virus with other respiratory manifestations: Secondary | ICD-10-CM | POA: Diagnosis not present

## 2018-10-04 DIAGNOSIS — M9901 Segmental and somatic dysfunction of cervical region: Secondary | ICD-10-CM | POA: Diagnosis not present

## 2018-10-04 DIAGNOSIS — J209 Acute bronchitis, unspecified: Secondary | ICD-10-CM | POA: Diagnosis not present

## 2018-10-04 DIAGNOSIS — M47812 Spondylosis without myelopathy or radiculopathy, cervical region: Secondary | ICD-10-CM | POA: Diagnosis not present

## 2018-10-04 DIAGNOSIS — M62830 Muscle spasm of back: Secondary | ICD-10-CM | POA: Diagnosis not present

## 2018-10-04 DIAGNOSIS — R05 Cough: Secondary | ICD-10-CM | POA: Diagnosis not present

## 2018-10-04 DIAGNOSIS — M542 Cervicalgia: Secondary | ICD-10-CM | POA: Diagnosis not present

## 2018-10-29 DIAGNOSIS — H401112 Primary open-angle glaucoma, right eye, moderate stage: Secondary | ICD-10-CM | POA: Diagnosis not present

## 2018-10-29 DIAGNOSIS — H401121 Primary open-angle glaucoma, left eye, mild stage: Secondary | ICD-10-CM | POA: Diagnosis not present

## 2018-11-05 DIAGNOSIS — Z6833 Body mass index (BMI) 33.0-33.9, adult: Secondary | ICD-10-CM | POA: Diagnosis not present

## 2018-11-05 DIAGNOSIS — Z1231 Encounter for screening mammogram for malignant neoplasm of breast: Secondary | ICD-10-CM | POA: Diagnosis not present

## 2018-11-05 DIAGNOSIS — Z01419 Encounter for gynecological examination (general) (routine) without abnormal findings: Secondary | ICD-10-CM | POA: Diagnosis not present

## 2018-11-23 DIAGNOSIS — E890 Postprocedural hypothyroidism: Secondary | ICD-10-CM | POA: Diagnosis not present

## 2018-12-03 DIAGNOSIS — H401112 Primary open-angle glaucoma, right eye, moderate stage: Secondary | ICD-10-CM | POA: Diagnosis not present

## 2018-12-03 DIAGNOSIS — H401121 Primary open-angle glaucoma, left eye, mild stage: Secondary | ICD-10-CM | POA: Diagnosis not present

## 2018-12-03 DIAGNOSIS — H25013 Cortical age-related cataract, bilateral: Secondary | ICD-10-CM | POA: Diagnosis not present

## 2018-12-03 DIAGNOSIS — H2513 Age-related nuclear cataract, bilateral: Secondary | ICD-10-CM | POA: Diagnosis not present

## 2018-12-06 DIAGNOSIS — M62830 Muscle spasm of back: Secondary | ICD-10-CM | POA: Diagnosis not present

## 2018-12-06 DIAGNOSIS — M47812 Spondylosis without myelopathy or radiculopathy, cervical region: Secondary | ICD-10-CM | POA: Diagnosis not present

## 2018-12-06 DIAGNOSIS — M542 Cervicalgia: Secondary | ICD-10-CM | POA: Diagnosis not present

## 2018-12-06 DIAGNOSIS — M9901 Segmental and somatic dysfunction of cervical region: Secondary | ICD-10-CM | POA: Diagnosis not present

## 2018-12-17 DIAGNOSIS — H25013 Cortical age-related cataract, bilateral: Secondary | ICD-10-CM | POA: Diagnosis not present

## 2018-12-17 DIAGNOSIS — H401112 Primary open-angle glaucoma, right eye, moderate stage: Secondary | ICD-10-CM | POA: Diagnosis not present

## 2018-12-17 DIAGNOSIS — H401121 Primary open-angle glaucoma, left eye, mild stage: Secondary | ICD-10-CM | POA: Diagnosis not present

## 2018-12-17 DIAGNOSIS — H2513 Age-related nuclear cataract, bilateral: Secondary | ICD-10-CM | POA: Diagnosis not present

## 2019-02-04 DIAGNOSIS — L723 Sebaceous cyst: Secondary | ICD-10-CM | POA: Diagnosis not present

## 2019-02-19 DIAGNOSIS — M62830 Muscle spasm of back: Secondary | ICD-10-CM | POA: Diagnosis not present

## 2019-02-19 DIAGNOSIS — M9901 Segmental and somatic dysfunction of cervical region: Secondary | ICD-10-CM | POA: Diagnosis not present

## 2019-02-19 DIAGNOSIS — M47812 Spondylosis without myelopathy or radiculopathy, cervical region: Secondary | ICD-10-CM | POA: Diagnosis not present

## 2019-02-19 DIAGNOSIS — M542 Cervicalgia: Secondary | ICD-10-CM | POA: Diagnosis not present

## 2019-02-20 DIAGNOSIS — M9901 Segmental and somatic dysfunction of cervical region: Secondary | ICD-10-CM | POA: Diagnosis not present

## 2019-02-20 DIAGNOSIS — M542 Cervicalgia: Secondary | ICD-10-CM | POA: Diagnosis not present

## 2019-02-20 DIAGNOSIS — M9906 Segmental and somatic dysfunction of lower extremity: Secondary | ICD-10-CM | POA: Diagnosis not present

## 2019-02-20 DIAGNOSIS — M47812 Spondylosis without myelopathy or radiculopathy, cervical region: Secondary | ICD-10-CM | POA: Diagnosis not present

## 2019-02-21 DIAGNOSIS — M9901 Segmental and somatic dysfunction of cervical region: Secondary | ICD-10-CM | POA: Diagnosis not present

## 2019-02-21 DIAGNOSIS — M542 Cervicalgia: Secondary | ICD-10-CM | POA: Diagnosis not present

## 2019-02-21 DIAGNOSIS — M9906 Segmental and somatic dysfunction of lower extremity: Secondary | ICD-10-CM | POA: Diagnosis not present

## 2019-02-21 DIAGNOSIS — M47812 Spondylosis without myelopathy or radiculopathy, cervical region: Secondary | ICD-10-CM | POA: Diagnosis not present

## 2019-02-25 DIAGNOSIS — M9901 Segmental and somatic dysfunction of cervical region: Secondary | ICD-10-CM | POA: Diagnosis not present

## 2019-02-25 DIAGNOSIS — M47812 Spondylosis without myelopathy or radiculopathy, cervical region: Secondary | ICD-10-CM | POA: Diagnosis not present

## 2019-02-25 DIAGNOSIS — M542 Cervicalgia: Secondary | ICD-10-CM | POA: Diagnosis not present

## 2019-02-25 DIAGNOSIS — M9906 Segmental and somatic dysfunction of lower extremity: Secondary | ICD-10-CM | POA: Diagnosis not present

## 2019-04-23 DIAGNOSIS — B349 Viral infection, unspecified: Secondary | ICD-10-CM | POA: Diagnosis not present

## 2019-04-25 DIAGNOSIS — R509 Fever, unspecified: Secondary | ICD-10-CM | POA: Diagnosis not present

## 2019-05-28 DIAGNOSIS — E890 Postprocedural hypothyroidism: Secondary | ICD-10-CM | POA: Diagnosis not present

## 2019-06-26 DIAGNOSIS — R1013 Epigastric pain: Secondary | ICD-10-CM | POA: Diagnosis not present

## 2019-08-16 DIAGNOSIS — Z1211 Encounter for screening for malignant neoplasm of colon: Secondary | ICD-10-CM | POA: Diagnosis not present

## 2019-08-16 DIAGNOSIS — E039 Hypothyroidism, unspecified: Secondary | ICD-10-CM | POA: Diagnosis not present

## 2019-08-16 DIAGNOSIS — Z5181 Encounter for therapeutic drug level monitoring: Secondary | ICD-10-CM | POA: Diagnosis not present

## 2019-08-16 DIAGNOSIS — Z0000 Encounter for general adult medical examination without abnormal findings: Secondary | ICD-10-CM | POA: Diagnosis not present

## 2019-08-16 DIAGNOSIS — Z23 Encounter for immunization: Secondary | ICD-10-CM | POA: Diagnosis not present

## 2019-08-16 DIAGNOSIS — E785 Hyperlipidemia, unspecified: Secondary | ICD-10-CM | POA: Diagnosis not present

## 2019-08-16 DIAGNOSIS — R69 Illness, unspecified: Secondary | ICD-10-CM | POA: Diagnosis not present

## 2019-10-15 DIAGNOSIS — E039 Hypothyroidism, unspecified: Secondary | ICD-10-CM | POA: Diagnosis not present

## 2019-10-29 ENCOUNTER — Other Ambulatory Visit: Payer: Self-pay

## 2019-10-29 DIAGNOSIS — Z20822 Contact with and (suspected) exposure to covid-19: Secondary | ICD-10-CM

## 2019-10-30 LAB — NOVEL CORONAVIRUS, NAA: SARS-CoV-2, NAA: NOT DETECTED

## 2019-10-31 DIAGNOSIS — H401121 Primary open-angle glaucoma, left eye, mild stage: Secondary | ICD-10-CM | POA: Diagnosis not present

## 2019-10-31 DIAGNOSIS — H401112 Primary open-angle glaucoma, right eye, moderate stage: Secondary | ICD-10-CM | POA: Diagnosis not present

## 2024-11-23 ENCOUNTER — Other Ambulatory Visit: Payer: Self-pay | Admitting: Medical Genetics
